# Patient Record
Sex: Male | Born: 1960 | Race: White | Hispanic: No | Marital: Married | Smoking: Former smoker
Health system: Southern US, Community
[De-identification: ages and names within clinical notes are randomized; demographics above are authoritative.]

## PROBLEM LIST (undated history)

## (undated) DIAGNOSIS — M1990 Unspecified osteoarthritis, unspecified site: Secondary | ICD-10-CM

---

## 1979-05-31 HISTORY — PX: APPENDECTOMY: SHX54

## 1981-05-30 HISTORY — PX: BACK SURGERY: SHX140

## 1994-05-30 HISTORY — PX: CHOLECYSTECTOMY: SHX55

## 2005-11-29 ENCOUNTER — Ambulatory Visit: Payer: Self-pay | Admitting: Cardiology

## 2006-01-10 ENCOUNTER — Ambulatory Visit: Payer: Self-pay | Admitting: Cardiology

## 2010-12-09 ENCOUNTER — Other Ambulatory Visit: Payer: Self-pay | Admitting: Neurosurgery

## 2010-12-09 DIAGNOSIS — M549 Dorsalgia, unspecified: Secondary | ICD-10-CM

## 2010-12-12 ENCOUNTER — Ambulatory Visit
Admission: RE | Admit: 2010-12-12 | Discharge: 2010-12-12 | Disposition: A | Discharge status: Home / Self Care | Payer: BC Managed Care – PPO | Source: Ambulatory Visit | Attending: Neurosurgery | Admitting: Neurosurgery

## 2010-12-12 DIAGNOSIS — M549 Dorsalgia, unspecified: Secondary | ICD-10-CM

## 2012-05-30 HISTORY — PX: JOINT REPLACEMENT: SHX530

## 2019-01-24 ENCOUNTER — Encounter: Payer: Self-pay | Admitting: Orthopedic Surgery

## 2019-02-13 ENCOUNTER — Other Ambulatory Visit: Payer: Self-pay

## 2019-02-13 ENCOUNTER — Ambulatory Visit: Payer: BC Managed Care – PPO

## 2019-02-13 ENCOUNTER — Ambulatory Visit: Payer: BC Managed Care – PPO | Admitting: Orthopedic Surgery

## 2019-02-13 VITALS — BP 120/77 | HR 77 | Temp 97.5°F | Ht 73.0 in | Wt 391.0 lb

## 2019-02-13 DIAGNOSIS — M25551 Pain in right hip: Secondary | ICD-10-CM | POA: Diagnosis not present

## 2019-02-13 NOTE — Progress Notes (Signed)
James Bernard  02/13/2019  HISTORY SECTION :  Chief Complaint  Patient presents with  . Hip Pain    Right hip pain.   The patient presents for evaluation of right hip pain  This is a 58 year old male with a BMI of 51.59 who had a left total hip back in 2014 with Dr. Case and even he says he had a small left leg length discrepancy who now presents with 3 or 4-year history of right groin pain radiating to his buttocks with deep dull sharp stabbing pain associated with decreased range of motion trouble bathing trouble putting on his shoes and socks trouble walking and trouble and difficulty coaching football softball and swimming.  He is previously scheduled for replacement at [facility] but they told him he needed to lose weight he did lose the weight got down to 315 pounds giving him a BMI of about 40 but when he came back for the surgery they told him he had to stop smoking.  Dr. Case no longer does hip replacements and he presents to [place] for evaluation    Review of Systems  Musculoskeletal: Positive for joint pain. Negative for back pain.  All other systems reviewed and are negative.    has no past medical history on file.   Medical history he says he has no medical problems  He has had a left total hip and appendectomy in 1982 back surgery 1993 and a gallbladder surgery 1994  Body mass index is 51.59 kg/m.   No Known Allergies   Current Outpatient Medications:  .  diclofenac (VOLTAREN) 75 MG EC tablet, Take 75 mg by mouth 2 (two) times daily., Disp: , Rfl:    PHYSICAL EXAM SECTION: 1) BP 120/77   Pulse 77   Temp (!) 97.5 F (36.4 C)   Ht 6\' 1"  (1.854 m)   Wt (!) 391 lb (177.4 kg)   BMI 51.59 kg/m   Body mass index is 51.59 kg/m. General appearance: Well-developed well-nourished no gross deformities  2) Cardiovascular normal pulse and perfusion in the lower  extremities normal color without edema  3) Neurologically deep tendon reflexes are equal and normal, no  sensation loss or deficits no pathologic reflexes  4) Psychological: Awake alert and oriented x3 mood and affect normal  5) Skin no lacerations or ulcerations no nodularity no palpable masses, no erythema or nodularity  6) Musculoskeletal:   Gait limp and waddles   In the seated position with both knees extended I do not feel the leg length discrepancy they seem to be equal  His left hip motion is 125 degrees of hip flexion with some external rotation on flexion he has no pain when I do this.  He has good hip flexion strength normal muscle tone  On the right side he has decreased abduction painful flexion at 100 degrees painful internal rotation at 5 degrees and limited abduction  No atrophy or weakness is noted  MEDICAL DECISION SECTION:  Encounter Diagnosis  Name Primary?  . Pain in right hip Yes    Imaging X-ray from 2016 shows at least moderate to severe arthritis and the x-ray we took today shows severe arthritis of the right hip with secondary bone changes of sclerosis cyst formation and osteophyte formation    Plan:  (Rx., Inj., surg., Frx, MRI/CT, XR:2)  I recommended that he see a joint replacement surgery for this operation.  He also needs to lose weight to about 315 that would give him about a  40 BMI.  There is no way that we could do the surgery here with limited assistants  I told him I would make him a referral to a joint replacement specialist  5:24 PM Fuller CanadaStanley , MD  02/13/2019

## 2019-02-13 NOTE — Patient Instructions (Signed)
Total Hip Replacement  Total hip replacement is a surgery to replace your damaged hip joint. Your hip joint is replaced with a man-made (artificial) hip joint. This man-made hip joint is called a prosthesis. This surgery is done to lessen pain and to help your hip move better. What happens before the procedure? Staying hydrated Follow instructions from your doctor about drinking fluids. This may include:  Up to 2 hours before surgery - you may keep drinking clear liquids. These include: ? Water. ? Clear fruit juice. ? Black coffee. ? Plain tea. Eating and drinking restrictions Follow instructions from your doctor about eating and drinking. These may include:  8 hours before surgery - stop eating heavy meals or foods. These include meat, fried foods, and fatty foods.  6 hours before surgery - stop eating light meals or foods. These include toast and cereal.  6 hours before surgery - stop drinking milk or drinks that have milk in them.  2 hours before surgery - stop drinking clear liquids. Medicines Ask your doctor about:  Changing or stopping your normal medicines. This is important if you take diabetes medicines or blood thinners.  Taking medicines such as aspirin and ibuprofen. These can thin your blood. Do not take these medicines unless your doctor tells you to take them.  Taking over-the-counter medicines, vitamins, herbs, and supplements. General instructions  You may have a physical exam.  You may have tests, such as: ? X-rays or MRI. ? Blood or urine tests.  Plan to have someone take you home.  Plan to have someone you trust take care of you for at least 24 hours after you leave the hospital or clinic. This is important.  Prepare your home so you can be safe and have easy access to what you need.  Have teeth cleanings or any dental work done weeks before your surgery, or wait until a few weeks after your surgery.  Avoid shaving your legs just before surgery. If  any shaving is needed, it will be done in the hospital.  Ask your doctor how your surgical site will be marked or identified. What happens during the procedure?  To lower your risk of infection: ? Your health care team will wash or sanitize their hands. ? Hair may be removed from the surgical area. ? Your skin will be washed with soap.  An IV tube will be put into one of your veins.  You will be given one or more of the following: ? A medicine to help you relax (sedative). ? A medicine to make you fall asleep (general anesthetic). ? A medicine to numb your body below the waist (spinal anesthetic).  Your doctor will make a cut (incision) in your hip. The place where the cut is made will depend on the approach used by the doctor: ? Posterior approach. The cut will be at the back of the hip. ? Anterior approach. The cut will be at the front of the hip.  Then, your doctor will: ? Use his or her hands to move your hip out of position (dislocate it). ? Cut and take out damaged parts of your hip joint. ? Put a man-made hip joint into place. ? Do an X-ray of the hip joint to make sure it is in the right place. ? Place a drain to remove extra fluid, if needed. ? Close the cut and place a bandage (dressing) over it. The procedure may vary among doctors and hospitals. What happens after the procedure?  Your health care team will: ? Monitor you until you leave the hospital. ? Check your blood pressure, heart rate, breathing rate, and blood oxygen level. ? Check if you can move your foot and can feel sensations in it. ? Give you pain medicine.  Your doctor will tell you to take actions to help prevent blood clots and reduce swelling in your legs. You may need to: ? Wear a type of socks that are tight (compression stockings). ? Take medicines to thin your blood (anticoagulants).  You will do exercises (physical therapy) until you are doing well. Your doctor will tell you when you are well  enough to go home.  You may need to use a walker or crutches.  You may need to use a wedge pillow (hip abduction pillow) when you are in bed. This pillow will keep your legs from turning in ways that may cause your new hip joint to move out of place. Summary  Total hip replacement is a surgery to replace your damaged hip joint. Your hip joint is replaced with a man-made (artificial) hip joint.  Follow instructions from your doctor about eating and drinking before the procedure.  Plan to have someone take you home from the hospital.  You may need to use a walker or crutches after surgery. This information is not intended to replace advice given to you by your health care provider. Make sure you discuss any questions you have with your health care provider. Document Released: 08/08/2011 Document Revised: 06/08/2018 Document Reviewed: 06/27/2017 Elsevier Interactive Patient Education  2020 [employer]  Calorie Counting for [employer] Calories are units of energy. Your body needs a certain amount of calories from food to keep you going throughout the day. When you eat more calories than your body needs, your body stores the extra calories as fat. When you eat fewer calories than your body needs, your body burns fat to get the energy it needs. Calorie counting means keeping track of how many calories you eat and drink each day. Calorie counting can be helpful if you need to lose weight. If you make sure to eat fewer calories than your body needs, you should lose weight. Ask your health care provider what a healthy weight is for you. For calorie counting to work, you will need to eat the right number of calories in a day in order to lose a healthy amount of weight per week. A dietitian can help you determine how many calories you need in a day and will give you suggestions on how to reach your calorie goal.  A healthy amount of weight to lose per week is usually 1-2 lb (0.5-0.9 kg). This usually  means that your daily calorie intake should be reduced by 500-750 calories.  Eating 1,200 - 1,500 calories per day can help most women lose weight.  Eating 1,500 - 1,800 calories per day can help most men lose weight. What is my plan? My goal is to have __________ calories per day. If I have this many calories per day, I should lose around __________ pounds per week. What do I need to know about calorie counting? In order to meet your daily calorie goal, you will need to:  Find out how many calories are in each food you would like to eat. Try to do this before you eat.  Decide how much of the food you plan to eat.  Write down what you ate and how many calories it had. Doing this is  called keeping a food log. To successfully lose weight, it is important to balance calorie counting with a healthy lifestyle that includes regular activity. Aim for 150 minutes of moderate exercise (such as walking) or 75 minutes of vigorous exercise (such as running) each week. Where do I find calorie information?  The number of calories in a food can be found on a Nutrition Facts label. If a food does not have a Nutrition Facts label, try to look up the calories online or ask your dietitian for help. Remember that calories are listed per serving. If you choose to have more than one serving of a food, you will have to multiply the calories per serving by the amount of servings you plan to eat. For example, the label on a package of bread might say that a serving size is 1 slice and that there are 90 calories in a serving. If you eat 1 slice, you will have eaten 90 calories. If you eat 2 slices, you will have eaten 180 calories. How do I keep a food log? Immediately after each meal, record the following information in your food log:  What you ate. Don't forget to include toppings, sauces, and other extras on the food.  How much you ate. This can be measured in cups, ounces, or number of items.  How many  calories each food and drink had.  The total number of calories in the meal. Keep your food log near you, such as in a small notebook in your pocket, or use a mobile app or website. Some programs will calculate calories for you and show you how many calories you have left for the day to meet your goal. What are some calorie counting tips?   Use your calories on foods and drinks that will fill you up and not leave you hungry: ? Some examples of foods that fill you up are nuts and nut butters, vegetables, lean proteins, and high-fiber foods like whole grains. High-fiber foods are foods with more than 5 g fiber per serving. ? Drinks such as sodas, specialty coffee drinks, alcohol, and juices have a lot of calories, yet do not fill you up.  Eat nutritious foods and avoid empty calories. Empty calories are calories you get from foods or beverages that do not have many vitamins or protein, such as candy, sweets, and soda. It is better to have a nutritious high-calorie food (such as an avocado) than a food with few nutrients (such as a bag of chips).  Know how many calories are in the foods you eat most often. This will help you calculate calorie counts faster.  Pay attention to calories in drinks. Low-calorie drinks include water and unsweetened drinks.  Pay attention to nutrition labels for "low fat" or "fat free" foods. These foods sometimes have the same amount of calories or more calories than the full fat versions. They also often have added sugar, starch, or salt, to make up for flavor that was removed with the fat.  Find a way of tracking calories that works for you. Get creative. Try different apps or programs if writing down calories does not work for you. What are some portion control tips?  Know how many calories are in a serving. This will help you know how many servings of a certain food you can have.  Use a measuring cup to measure serving sizes. You could also try weighing out  portions on a kitchen scale. With time, you will be able to  estimate serving sizes for some foods.  Take some time to put servings of different foods on your favorite plates, bowls, and cups so you know what a serving looks like.  Try not to eat straight from a bag or box. Doing this can lead to overeating. Put the amount you would like to eat in a cup or on a plate to make sure you are eating the right portion.  Use smaller plates, glasses, and bowls to prevent overeating.  Try not to multitask (for example, watch TV or use your computer) while eating. If it is time to eat, sit down at a table and enjoy your food. This will help you to know when you are full. It will also help you to be aware of what you are eating and how much you are eating. What are tips for following this plan? Reading food labels  Check the calorie count compared to the serving size. The serving size may be smaller than what you are used to eating.  Check the source of the calories. Make sure the food you are eating is high in vitamins and protein and low in saturated and trans fats. Shopping  Read nutrition labels while you shop. This will help you make healthy decisions before you decide to purchase your food.  Make a grocery list and stick to it. Cooking  Try to cook your favorite foods in a healthier way. For example, try baking instead of frying.  Use low-fat dairy products. Meal planning  Use more fruits and vegetables. Half of your plate should be fruits and vegetables.  Include lean proteins like poultry and fish. How do I count calories when eating out?  Ask for smaller portion sizes.  Consider sharing an entree and sides instead of getting your own entree.  If you get your own entree, eat only half. Ask for a box at the beginning of your meal and put the rest of your entree in it so you are not tempted to eat it.  If calories are listed on the menu, choose the lower calorie options.  Choose  dishes that include vegetables, fruits, whole grains, low-fat dairy products, and lean protein.  Choose items that are boiled, broiled, grilled, or steamed. Stay away from items that are buttered, battered, fried, or served with cream sauce. Items labeled "crispy" are usually fried, unless stated otherwise.  Choose water, low-fat milk, unsweetened iced tea, or other drinks without added sugar. If you want an alcoholic beverage, choose a lower calorie option such as a glass of wine or light beer.  Ask for dressings, sauces, and syrups on the side. These are usually high in calories, so you should limit the amount you eat.  If you want a salad, choose a garden salad and ask for grilled meats. Avoid extra toppings like bacon, cheese, or fried items. Ask for the dressing on the side, or ask for olive oil and vinegar or lemon to use as dressing.  Estimate how many servings of a food you are given. For example, a serving of cooked rice is  cup or about the size of half a baseball. Knowing serving sizes will help you be aware of how much food you are eating at restaurants. The list below tells you how big or small some common portion sizes are based on everyday objects: ? 1 oz-4 stacked dice. ? 3 oz-1 deck of cards. ? 1 tsp-1 die. ? 1 Tbsp- a ping-pong ball. ? 2 Tbsp-1 ping-pong ball. ?  cup- baseball. ? 1 cup-1 baseball. Summary  Calorie counting means keeping track of how many calories you eat and drink each day. If you eat fewer calories than your body needs, you should lose weight.  A healthy amount of weight to lose per week is usually 1-2 lb (0.5-0.9 kg). This usually means reducing your daily calorie intake by 500-750 calories.  The number of calories in a food can be found on a Nutrition Facts label. If a food does not have a Nutrition Facts label, try to look up the calories online or ask your dietitian for help.  Use your calories on foods and drinks that will fill you up, and not on  foods and drinks that will leave you hungry.  Use smaller plates, glasses, and bowls to prevent overeating. This information is not intended to replace advice given to you by your health care provider. Make sure you discuss any questions you have with your health care provider. Document Released: 05/16/2005 Document Revised: 02/02/2018 Document Reviewed: 04/15/2016 Elsevier Patient Education  2020 [employer].

## 2020-03-26 NOTE — Patient Instructions (Addendum)
DUE TO COVID-19 ONLY ONE VISITOR IS ALLOWED TO COME WITH YOU AND STAY IN THE WAITING ROOM ONLY DURING PRE OP AND PROCEDURE DAY OF SURGERY. THE 1 VISITOR  MAY VISIT WITH YOU AFTER SURGERY IN YOUR PRIVATE ROOM DURING VISITING HOURS ONLY!    PLEASE BEGIN THE QUARANTINE INSTRUCTIONS AS OUTLINED IN YOUR HANDOUT.                James Bernard   Your procedure is scheduled on: 04/08/20   Report to [facility] Main  Entrance   Report to admitting at  8:50 AM     Call this number if you have problems the morning of surgery [phone]      BRUSH YOUR TEETH MORNING OF SURGERY AND RINSE YOUR MOUTH OUT, NO CHEWING GUM CANDY OR MINTS.   No food after midnight.    You may have clear liquid until 8:30 AM.    At 8:30 AM drink pre surgery drink.   Nothing by mouth after 8:30 AM.   Take these medicines the morning of surgery with A SIP OF WATER: None                                 You may not have any metal on your body including               piercings  Do not wear jewelry,  lotions, powders or deodorant          .              Men may shave face and neck.   Do not bring valuables to the hospital. [facility] IS NOT             RESPONSIBLE   FOR VALUABLES.  Contacts, dentures or bridgework may not be worn into surgery.       Patients discharged the day of surgery will not be allowed to drive home.   IF YOU ARE HAVING SURGERY AND GOING HOME THE SAME DAY, YOU MUST HAVE AN ADULT TO DRIVE YOU HOME AND BE WITH YOU FOR 24 HOURS.   YOU MAY GO HOME BY TAXI OR UBER OR ORTHERWISE, BUT AN ADULT MUST ACCOMPANY YOU HOME AND STAY WITH YOU FOR 24 HOURS.  Name and phone number of your driver:  Special Instructions: N/A              Please read over the following fact sheets you were given: _____________________________________________________________________             [facility] - Preparing for Surgery Before surgery, you can play an important role.  Because skin is not  sterile, your skin needs to be as free of germs as possible.  You can reduce the number of germs on your skin by washing with CHG (chlorahexidine gluconate) soap before surgery.  CHG is an antiseptic cleaner which kills germs and bonds with the skin to continue killing germs even after washing. Please DO NOT use if you have an allergy to CHG or antibacterial soaps.  If your skin becomes reddened/irritated stop using the CHG and inform your nurse when you arrive at Short Stay.   You may shave your face/neck.  Please follow these instructions carefully:  1.  Shower with CHG Soap the night before surgery and the  morning of Surgery.  2.  If you choose to wash your hair, wash your hair  first as usual with your  normal  shampoo.  3.  After you shampoo, rinse your hair and body thoroughly to remove the  shampoo.                                        4.  Use CHG as you would any other liquid soap.  You can apply chg directly  to the skin and wash                       Gently with a scrungie or clean washcloth.  5.  Apply the CHG Soap to your body ONLY FROM THE NECK DOWN.   Do not use on face/ open                           Wound or open sores. Avoid contact with eyes, ears mouth and genitals (private parts).                       Wash face,  Genitals (private parts) with your normal soap.             6.  Wash thoroughly, paying special attention to the area where your surgery  will be performed.  7.  Thoroughly rinse your body with warm water from the neck down.  8.  DO NOT shower/wash with your normal soap after using and rinsing off  the CHG Soap.             9.  Pat yourself dry with a clean towel.            10.  Wear clean pajamas.            11.  Place clean sheets on your bed the night of your first shower and do not  sleep with pets. Day of Surgery : Do not apply any lotions/deodorants the morning of surgery.  Please wear clean clothes to the hospital/surgery center.  FAILURE TO FOLLOW THESE  INSTRUCTIONS MAY RESULT IN THE CANCELLATION OF YOUR SURGERY PATIENT SIGNATURE_________________________________  NURSE SIGNATURE__________________________________  ________________________________________________________________________   James Bernard  An incentive spirometer is a tool that can help keep your lungs clear and active. This tool measures how well you are filling your lungs with each breath. Taking long deep breaths may help reverse or decrease the chance of developing breathing (pulmonary) problems (especially infection) following:  A long period of time when you are unable to move or be active. BEFORE THE PROCEDURE   If the spirometer includes an indicator to show your best effort, your nurse or respiratory therapist will set it to a desired goal.  If possible, sit up straight or lean slightly forward. Try not to slouch.  Hold the incentive spirometer in an upright position. INSTRUCTIONS FOR USE  1. Sit on the edge of your bed if possible, or sit up as far as you can in bed or on a chair. 2. Hold the incentive spirometer in an upright position. 3. Breathe out normally. 4. Place the mouthpiece in your mouth and seal your lips tightly around it. 5. Breathe in slowly and as deeply as possible, raising the piston or the ball toward the top of the column. 6. Hold your breath for 3-5 seconds or for as long as possible. Allow the piston or ball to  fall to the bottom of the column. 7. Remove the mouthpiece from your mouth and breathe out normally. 8. Rest for a few seconds and repeat Steps 1 through 7 at least 10 times every 1-2 hours when you are awake. Take your time and take a few normal breaths between deep breaths. 9. The spirometer may include an indicator to show your best effort. Use the indicator as a goal to work toward during each repetition. 10. After each set of 10 deep breaths, practice coughing to be sure your lungs are clear. If you have an incision (the  cut made at the time of surgery), support your incision when coughing by placing a pillow or rolled up towels firmly against it. Once you are able to get out of bed, walk around indoors and cough well. You may stop using the incentive spirometer when instructed by your caregiver.  RISKS AND COMPLICATIONS  Take your time so you do not get dizzy or light-headed.  If you are in pain, you may need to take or ask for pain medication before doing incentive spirometry. It is harder to take a deep breath if you are having pain. AFTER USE  Rest and breathe slowly and easily.  It can be helpful to keep track of a log of your progress. Your caregiver can provide you with a simple table to help with this. If you are using the spirometer at home, follow these instructions: SEEK MEDICAL CARE IF:   You are having difficultly using the spirometer.  You have trouble using the spirometer as often as instructed.  Your pain medication is not giving enough relief while using the spirometer.  You develop fever of 100.5 F (38.1 C) or higher. SEEK IMMEDIATE MEDICAL CARE IF:   You cough up bloody sputum that had not been present before.  You develop fever of 102 F (38.9 C) or greater.  You develop worsening pain at or near the incision site. MAKE SURE YOU:   Understand these instructions.  Will watch your condition.  Will get help right away if you are not doing well or get worse. Document Released: 09/26/2006 Document Revised: 08/08/2011 Document Reviewed: 11/27/2006 [facility] Patient Information 2014 [place], [place].   ________________________________________________________________________

## 2020-03-27 ENCOUNTER — Ambulatory Visit: Payer: Self-pay | Admitting: Student

## 2020-03-31 ENCOUNTER — Encounter (HOSPITAL_COMMUNITY): Payer: Self-pay

## 2020-03-31 ENCOUNTER — Other Ambulatory Visit: Payer: Self-pay

## 2020-03-31 ENCOUNTER — Encounter (HOSPITAL_COMMUNITY)
Admission: RE | Admit: 2020-03-31 | Discharge: 2020-03-31 | Disposition: A | Discharge status: Home / Self Care | Payer: BC Managed Care – PPO | Source: Ambulatory Visit | Attending: Orthopedic Surgery | Admitting: Orthopedic Surgery

## 2020-03-31 DIAGNOSIS — Z01812 Encounter for preprocedural laboratory examination: Secondary | ICD-10-CM | POA: Insufficient documentation

## 2020-03-31 HISTORY — DX: Unspecified osteoarthritis, unspecified site: M19.90

## 2020-03-31 LAB — COMPREHENSIVE METABOLIC PANEL
ALT: 26 U/L (ref 0–44)
AST: 19 U/L (ref 15–41)
Albumin: 4.2 g/dL (ref 3.5–5.0)
Alkaline Phosphatase: 57 U/L (ref 38–126)
Anion gap: 9 (ref 5–15)
BUN: 17 mg/dL (ref 8–23)
CO2: 30 mmol/L (ref 22–32)
Calcium: 9.2 mg/dL (ref 8.9–10.3)
Chloride: 100 mmol/L (ref 98–111)
Creatinine, Ser: 0.78 mg/dL (ref 0.61–1.24)
GFR, Estimated: >60 mL/min (ref >60)
Glucose, Bld: 99 mg/dL (ref 70–99)
Potassium: 5 mmol/L (ref 3.5–5.1)
Sodium: 139 mmol/L (ref 135–145)
Total Bilirubin: 0.7 mg/dL (ref 0.3–1.2)
Total Protein: 7.5 g/dL (ref 6.5–8.1)

## 2020-03-31 LAB — CBC
HCT: 47.6 % (ref 39.0–52.0)
Hemoglobin: 14.7 g/dL (ref 13.0–17.0)
MCH: 28.1 pg (ref 26.0–34.0)
MCHC: 30.9 g/dL (ref 30.0–36.0)
MCV: 90.8 fL (ref 80.0–100.0)
Platelets: 256 10*3/uL (ref 150–400)
RBC: 5.24 MIL/uL (ref 4.22–5.81)
RDW: 14.1 % (ref 11.5–15.5)
WBC: 8 10*3/uL (ref 4.0–10.5)
nRBC: 0 % (ref 0.0–0.2)

## 2020-03-31 LAB — URINALYSIS, ROUTINE W REFLEX MICROSCOPIC
Bilirubin Urine: NEGATIVE
Glucose, UA: NEGATIVE mg/dL
Hgb urine dipstick: NEGATIVE
Ketones, ur: NEGATIVE mg/dL
Leukocytes,Ua: NEGATIVE
Nitrite: NEGATIVE
Protein, ur: NEGATIVE mg/dL
Specific Gravity, Urine: 1.026 (ref 1.005–1.030)
pH: 6 (ref 5.0–8.0)

## 2020-03-31 LAB — SURGICAL PCR SCREEN
MRSA, PCR: NEGATIVE
Staphylococcus aureus: NEGATIVE

## 2020-03-31 LAB — PROTIME-INR
INR: 1 (ref 0.8–1.2)
Prothrombin Time: 12.4 seconds (ref 11.4–15.2)

## 2020-03-31 NOTE — Progress Notes (Signed)
COVID Vaccine Completed:yes Date COVID Vaccine completed:08/26/19 COVID vaccine manufacturer:   Moderna     PCP - Dr. Nickie Retort Cardiologist - no  Chest x-ray - no EKG - no Stress Test - no ECHO - no Cardiac Cath - no Pacemaker/ICD device last checked:NA  Sleep Study - no CPAP -   Fasting Blood Sugar - NA Checks Blood Sugar _____ times a day  Blood Thinner Instructions:NA Aspirin Instructions: Last Dose:  Anesthesia review:   Patient denies shortness of breath, fever, cough and chest pain at PAT appointment  yes  Patient verbalized understanding of instructions that were given to them at the PAT appointment. Patient was also instructed that they will need to review over the PAT instructions again at home before surgery. Yes Pt is unable to climb stairs. He reports that he has no SOB working or with ADLs. He has a BMI of47.2

## 2020-04-01 ENCOUNTER — Encounter (HOSPITAL_COMMUNITY): Payer: Self-pay

## 2020-04-03 ENCOUNTER — Ambulatory Visit: Payer: Self-pay | Admitting: Student

## 2020-04-03 NOTE — H&P (Signed)
TOTAL HIP ADMISSION H&P  Patient is admitted for right total hip arthroplasty.  Subjective:  Chief Complaint: right hip pain  HPI: James Bernard, 59 y.o. male, has a history of pain and functional disability in the right hip(s) due to avascular necrosis and patient has failed non-surgical conservative treatments for greater than 12 weeks to include NSAID's and/or analgesics, corticosteriod injections and activity modification.  Onset of symptoms was gradual starting 3 years ago with rapidlly worsening course since that time.The patient noted no past surgery on the right hip(s).  Patient currently rates pain in the right hip at 8 out of 10 with activity. Patient has worsening of pain with activity and weight bearing, pain that interfers with activities of daily living and pain with passive range of motion. Patient has evidence of joint space narrowing and collapse of the femoral head by imaging studies. This condition presents safety issues increasing the risk of falls. There is no current active infection.  There are no problems to display for this patient.  Past Medical History:  Diagnosis Date  . Arthritis    hands, shoulders    Past Surgical History:  Procedure Laterality Date  . APPENDECTOMY  1981  . BACK SURGERY  1983   L4-L5  . CHOLECYSTECTOMY  1996  . JOINT REPLACEMENT Left 2014    Current Outpatient Medications  Medication Sig Dispense Refill Last Dose  . acetaminophen (TYLENOL) 500 MG tablet Take 1,000 mg by mouth every 6 (six) hours as needed for moderate pain.     Marland Kitchen diclofenac (VOLTAREN) 75 MG EC tablet Take 75 mg by mouth 2 (two) times daily. (Patient not taking: Reported on 03/26/2020)     . traMADol (ULTRAM) 50 MG tablet Take 50-100 mg by mouth every 6 (six) hours as needed for moderate pain.       No current facility-administered medications for this visit.   No Known Allergies  Social History   Tobacco Use  . Smoking status: Former Smoker    Packs/day: 0.25     Years: 15.00    Pack years: 3.75    Types: Cigarettes    Quit date: 03/31/2017    Years since quitting: 3.0  . Smokeless tobacco: Never Used  Substance Use Topics  . Alcohol use: Never    No family history on file.   Review of Systems  Constitutional: Negative.   HENT: Negative.   Eyes: Negative.   Respiratory: Negative.   Cardiovascular: Negative.   Gastrointestinal: Negative.   Endocrine: Negative.   Genitourinary: Negative.   Musculoskeletal: Positive for arthralgias.  Skin: Negative.   Allergic/Immunologic: Negative.   Neurological: Negative.   Hematological: Negative.   Psychiatric/Behavioral: Negative.     Objective:  Physical Exam Constitutional:      Appearance: He is obese.  HENT:     Head: Normocephalic.  Eyes:     Pupils: Pupils are equal, round, and reactive to light.  Cardiovascular:     Rate and Rhythm: Regular rhythm.     Heart sounds: Normal heart sounds.  Pulmonary:     Breath sounds: Normal breath sounds.  Abdominal:     Palpations: Abdomen is soft.     Tenderness: There is no abdominal tenderness.  Genitourinary:    Comments: Deferred Musculoskeletal:     Cervical back: Normal range of motion.     Comments: Examination of the right hip reveals no skin wounds or lesions. The pannicular fold is clear. He does have moderate trochanteric tenderness to palpation. The  right hip is extremely stiff. He has a 45 degree flexion contracture, and I can flex him up to 70 degrees. He internally rotates -10 degrees, and externally rotates 15 degrees. He has pain with flexion and rotation.  Skin:    General: Skin is warm and dry.  Neurological:     Mental Status: He is alert and oriented to person, place, and time.  Psychiatric:        Mood and Affect: Mood normal.     Vital signs in last 24 hours: @VSRANGES @  Labs:   Estimated body mass index is 47.2 kg/m as calculated from the following:   Height as of 03/31/20: 6' (1.829 m).   Weight as of  03/31/20: 157.9 kg.   Imaging Review Plain radiographs demonstrate severe degenerative joint disease of the right hip(s). The bone quality appears to be adequate for age and reported activity level.      Assessment/Plan:  End stage arthritis, right hip(s)  The patient history, physical examination, clinical judgement of the provider and imaging studies are consistent with end stage degenerative joint disease of the right hip(s) and total hip arthroplasty is deemed medically necessary. The treatment options including medical management, injection therapy, arthroscopy and arthroplasty were discussed at length. The risks and benefits of total hip arthroplasty were presented and reviewed. The risks due to aseptic loosening, infection, stiffness, dislocation/subluxation,  thromboembolic complications and other imponderables were discussed.  The patient acknowledged the explanation, agreed to proceed with the plan and consent was signed. Patient is being admitted for inpatient treatment for surgery, pain control, PT, OT, prophylactic antibiotics, VTE prophylaxis, progressive ambulation and ADL's and discharge planning.The patient is planning to be discharged home after an overnight stay

## 2020-04-03 NOTE — H&P (View-Only) (Signed)
TOTAL HIP ADMISSION H&P  Patient is admitted for right total hip arthroplasty.  Subjective:  Chief Complaint: right hip pain  HPI: James Bernard, 59 y.o. male, has a history of pain and functional disability in the right hip(s) due to avascular necrosis and patient has failed non-surgical conservative treatments for greater than 12 weeks to include NSAID's and/or analgesics, corticosteriod injections and activity modification.  Onset of symptoms was gradual starting 3 years ago with rapidlly worsening course since that time.The patient noted no past surgery on the right hip(s).  Patient currently rates pain in the right hip at 8 out of 10 with activity. Patient has worsening of pain with activity and weight bearing, pain that interfers with activities of daily living and pain with passive range of motion. Patient has evidence of joint space narrowing and collapse of the femoral head by imaging studies. This condition presents safety issues increasing the risk of falls. There is no current active infection.  There are no problems to display for this patient.  Past Medical History:  Diagnosis Date  . Arthritis    hands, shoulders    Past Surgical History:  Procedure Laterality Date  . APPENDECTOMY  1981  . BACK SURGERY  1983   L4-L5  . CHOLECYSTECTOMY  1996  . JOINT REPLACEMENT Left 2014    Current Outpatient Medications  Medication Sig Dispense Refill Last Dose  . acetaminophen (TYLENOL) 500 MG tablet Take 1,000 mg by mouth every 6 (six) hours as needed for moderate pain.     Marland Kitchen diclofenac (VOLTAREN) 75 MG EC tablet Take 75 mg by mouth 2 (two) times daily. (Patient not taking: Reported on 03/26/2020)     . traMADol (ULTRAM) 50 MG tablet Take 50-100 mg by mouth every 6 (six) hours as needed for moderate pain.       No current facility-administered medications for this visit.   No Known Allergies  Social History   Tobacco Use  . Smoking status: Former Smoker    Packs/day: 0.25     Years: 15.00    Pack years: 3.75    Types: Cigarettes    Quit date: 03/31/2017    Years since quitting: 3.0  . Smokeless tobacco: Never Used  Substance Use Topics  . Alcohol use: Never    No family history on file.   Review of Systems  Constitutional: Negative.   HENT: Negative.   Eyes: Negative.   Respiratory: Negative.   Cardiovascular: Negative.   Gastrointestinal: Negative.   Endocrine: Negative.   Genitourinary: Negative.   Musculoskeletal: Positive for arthralgias.  Skin: Negative.   Allergic/Immunologic: Negative.   Neurological: Negative.   Hematological: Negative.   Psychiatric/Behavioral: Negative.     Objective:  Physical Exam Constitutional:      Appearance: He is obese.  HENT:     Head: Normocephalic.  Eyes:     Pupils: Pupils are equal, round, and reactive to light.  Cardiovascular:     Rate and Rhythm: Regular rhythm.     Heart sounds: Normal heart sounds.  Pulmonary:     Breath sounds: Normal breath sounds.  Abdominal:     Palpations: Abdomen is soft.     Tenderness: There is no abdominal tenderness.  Genitourinary:    Comments: Deferred Musculoskeletal:     Cervical back: Normal range of motion.     Comments: Examination of the right hip reveals no skin wounds or lesions. The pannicular fold is clear. He does have moderate trochanteric tenderness to palpation. The  right hip is extremely stiff. He has a 45 degree flexion contracture, and I can flex him up to 70 degrees. He internally rotates -10 degrees, and externally rotates 15 degrees. He has pain with flexion and rotation.  Skin:    General: Skin is warm and dry.  Neurological:     Mental Status: He is alert and oriented to person, place, and time.  Psychiatric:        Mood and Affect: Mood normal.     Vital signs in last 24 hours: @VSRANGES@  Labs:   Estimated body mass index is 47.2 kg/m as calculated from the following:   Height as of 03/31/20: 6' (1.829 m).   Weight as of  03/31/20: 157.9 kg.   Imaging Review Plain radiographs demonstrate severe degenerative joint disease of the right hip(s). The bone quality appears to be adequate for age and reported activity level.      Assessment/Plan:  End stage arthritis, right hip(s)  The patient history, physical examination, clinical judgement of the provider and imaging studies are consistent with end stage degenerative joint disease of the right hip(s) and total hip arthroplasty is deemed medically necessary. The treatment options including medical management, injection therapy, arthroscopy and arthroplasty were discussed at length. The risks and benefits of total hip arthroplasty were presented and reviewed. The risks due to aseptic loosening, infection, stiffness, dislocation/subluxation,  thromboembolic complications and other imponderables were discussed.  The patient acknowledged the explanation, agreed to proceed with the plan and consent was signed. Patient is being admitted for inpatient treatment for surgery, pain control, PT, OT, prophylactic antibiotics, VTE prophylaxis, progressive ambulation and ADL's and discharge planning.The patient is planning to be discharged home after an overnight stay    

## 2020-04-07 MED ORDER — DEXTROSE 5 % IV SOLN
3.0000 g | INTRAVENOUS | Status: AC
  Administered 2020-04-08: 3 g via INTRAVENOUS
  Filled 2020-04-07: qty 3

## 2020-04-08 ENCOUNTER — Ambulatory Visit (HOSPITAL_COMMUNITY): Payer: BC Managed Care – PPO

## 2020-04-08 ENCOUNTER — Encounter (HOSPITAL_COMMUNITY): Payer: Self-pay | Admitting: Orthopedic Surgery

## 2020-04-08 ENCOUNTER — Encounter (HOSPITAL_COMMUNITY)
Admission: RE | Disposition: A | Discharge status: Home / Self Care | Payer: Self-pay | Source: Home / Self Care | Attending: Orthopedic Surgery

## 2020-04-08 ENCOUNTER — Other Ambulatory Visit: Payer: Self-pay

## 2020-04-08 ENCOUNTER — Observation Stay (HOSPITAL_COMMUNITY)
Admission: RE | Admit: 2020-04-08 | Discharge: 2020-04-11 | Disposition: A | Discharge status: Home / Self Care | Payer: BC Managed Care – PPO | Attending: Orthopedic Surgery | Admitting: Orthopedic Surgery

## 2020-04-08 ENCOUNTER — Ambulatory Visit (HOSPITAL_COMMUNITY): Payer: BC Managed Care – PPO | Admitting: Certified Registered Nurse Anesthetist

## 2020-04-08 DIAGNOSIS — Z419 Encounter for procedure for purposes other than remedying health state, unspecified: Secondary | ICD-10-CM

## 2020-04-08 DIAGNOSIS — Z09 Encounter for follow-up examination after completed treatment for conditions other than malignant neoplasm: Secondary | ICD-10-CM

## 2020-04-08 DIAGNOSIS — M1611 Unilateral primary osteoarthritis, right hip: Principal | ICD-10-CM | POA: Diagnosis present

## 2020-04-08 DIAGNOSIS — M25551 Pain in right hip: Secondary | ICD-10-CM | POA: Diagnosis present

## 2020-04-08 DIAGNOSIS — Z87442 Personal history of urinary calculi: Secondary | ICD-10-CM | POA: Insufficient documentation

## 2020-04-08 HISTORY — PX: TOTAL HIP ARTHROPLASTY: SHX124

## 2020-04-08 LAB — ABO/RH: ABO/RH(D): O POS

## 2020-04-08 LAB — TYPE AND SCREEN
ABO/RH(D): O POS
Antibody Screen: NEGATIVE

## 2020-04-08 SURGERY — ARTHROPLASTY, HIP, TOTAL, ANTERIOR APPROACH
Anesthesia: Spinal | Site: Hip | Laterality: Right

## 2020-04-08 MED ORDER — PROPOFOL 500 MG/50ML IV EMUL
INTRAVENOUS | Status: DC | PRN
  Administered 2020-04-08 (×2): 30 mg via INTRAVENOUS
  Administered 2020-04-08 (×2): 40 mg via INTRAVENOUS

## 2020-04-08 MED ORDER — SODIUM CHLORIDE 0.9 % IR SOLN
Status: DC | PRN
  Administered 2020-04-08: 1000 mL
  Administered 2020-04-08: 3000 mL

## 2020-04-08 MED ORDER — MIDAZOLAM HCL 2 MG/2ML IJ SOLN
INTRAMUSCULAR | Status: AC
  Filled 2020-04-08: qty 2

## 2020-04-08 MED ORDER — MORPHINE SULFATE (PF) 2 MG/ML IV SOLN: 0.5000 mg | INTRAVENOUS | Status: DC | PRN

## 2020-04-08 MED ORDER — METOCLOPRAMIDE HCL 5 MG PO TABS
5.0000 mg | ORAL_TABLET | Freq: Three times a day (TID) | ORAL | Status: DC | PRN
  Filled 2020-04-08: qty 1

## 2020-04-08 MED ORDER — ONDANSETRON HCL 4 MG/2ML IJ SOLN
INTRAMUSCULAR | Status: DC | PRN
  Administered 2020-04-08: 4 mg via INTRAVENOUS

## 2020-04-08 MED ORDER — POLYETHYLENE GLYCOL 3350 17 G PO PACK: 17.0000 g | PACK | Freq: Every day | ORAL | Status: DC | PRN

## 2020-04-08 MED ORDER — SODIUM CHLORIDE 0.9 % IV SOLN: INTRAVENOUS | Status: DC

## 2020-04-08 MED ORDER — METOCLOPRAMIDE HCL 5 MG/ML IJ SOLN: 5.0000 mg | Freq: Three times a day (TID) | INTRAMUSCULAR | Status: DC | PRN

## 2020-04-08 MED ORDER — HYDROCODONE-ACETAMINOPHEN 7.5-325 MG PO TABS
1.0000 | ORAL_TABLET | ORAL | Status: DC | PRN
  Administered 2020-04-08 – 2020-04-09 (×2): 2 via ORAL
  Administered 2020-04-09: 1 via ORAL
  Administered 2020-04-10 – 2020-04-11 (×8): 2 via ORAL
  Filled 2020-04-08 (×11): qty 2

## 2020-04-08 MED ORDER — MENTHOL 3 MG MT LOZG: 1.0000 | LOZENGE | OROMUCOSAL | Status: DC | PRN

## 2020-04-08 MED ORDER — ONDANSETRON HCL 4 MG/2ML IJ SOLN: 4.0000 mg | Freq: Four times a day (QID) | INTRAMUSCULAR | Status: DC | PRN

## 2020-04-08 MED ORDER — CHLORHEXIDINE GLUCONATE 0.12 % MT SOLN
15.0000 mL | Freq: Once | OROMUCOSAL | Status: AC
  Administered 2020-04-08: 15 mL via OROMUCOSAL

## 2020-04-08 MED ORDER — LACTATED RINGERS IV SOLN: INTRAVENOUS | Status: DC

## 2020-04-08 MED ORDER — PROMETHAZINE HCL 25 MG/ML IJ SOLN: 6.2500 mg | INTRAMUSCULAR | Status: DC | PRN

## 2020-04-08 MED ORDER — DIPHENHYDRAMINE HCL 12.5 MG/5ML PO ELIX
12.5000 mg | ORAL_SOLUTION | ORAL | Status: DC | PRN
  Administered 2020-04-08: 25 mg via ORAL
  Filled 2020-04-08: qty 10

## 2020-04-08 MED ORDER — SODIUM CHLORIDE (PF) 0.9 % IJ SOLN
INTRAMUSCULAR | Status: AC
  Filled 2020-04-08: qty 50

## 2020-04-08 MED ORDER — KETOROLAC TROMETHAMINE 30 MG/ML IJ SOLN
INTRAMUSCULAR | Status: AC
  Filled 2020-04-08: qty 1

## 2020-04-08 MED ORDER — FENTANYL CITRATE (PF) 100 MCG/2ML IJ SOLN
INTRAMUSCULAR | Status: AC
  Filled 2020-04-08: qty 2

## 2020-04-08 MED ORDER — ACETAMINOPHEN 10 MG/ML IV SOLN
1000.0000 mg | Freq: Once | INTRAVENOUS | Status: AC
  Administered 2020-04-08: 1000 mg via INTRAVENOUS
  Filled 2020-04-08: qty 100

## 2020-04-08 MED ORDER — DOCUSATE SODIUM 100 MG PO CAPS
100.0000 mg | ORAL_CAPSULE | Freq: Two times a day (BID) | ORAL | Status: DC
  Administered 2020-04-08 – 2020-04-11 (×6): 100 mg via ORAL
  Filled 2020-04-08 (×6): qty 1

## 2020-04-08 MED ORDER — ONDANSETRON HCL 4 MG PO TABS: 4.0000 mg | ORAL_TABLET | Freq: Four times a day (QID) | ORAL | Status: DC | PRN

## 2020-04-08 MED ORDER — POVIDONE-IODINE 10 % EX SWAB: 2.0000 "application " | Freq: Once | CUTANEOUS | Status: DC

## 2020-04-08 MED ORDER — ISOPROPYL ALCOHOL 70 % SOLN
Status: DC | PRN
  Administered 2020-04-08: 1 via TOPICAL

## 2020-04-08 MED ORDER — ALBUMIN HUMAN 5 % IV SOLN
INTRAVENOUS | Status: AC
  Filled 2020-04-08: qty 250

## 2020-04-08 MED ORDER — KETOROLAC TROMETHAMINE 30 MG/ML IJ SOLN: 30.0000 mg | Freq: Once | INTRAMUSCULAR | Status: DC | PRN

## 2020-04-08 MED ORDER — GLYCOPYRROLATE PF 0.2 MG/ML IJ SOSY
PREFILLED_SYRINGE | INTRAMUSCULAR | Status: DC | PRN
  Administered 2020-04-08: .2 mg via INTRAVENOUS

## 2020-04-08 MED ORDER — METHOCARBAMOL 1000 MG/10ML IJ SOLN
500.0000 mg | Freq: Four times a day (QID) | INTRAVENOUS | Status: DC | PRN
  Filled 2020-04-08: qty 5

## 2020-04-08 MED ORDER — MEPERIDINE HCL 50 MG/ML IJ SOLN: 6.2500 mg | INTRAMUSCULAR | Status: DC | PRN

## 2020-04-08 MED ORDER — DEXAMETHASONE SODIUM PHOSPHATE 10 MG/ML IJ SOLN
INTRAMUSCULAR | Status: DC | PRN
  Administered 2020-04-08: 5 mg via INTRAVENOUS

## 2020-04-08 MED ORDER — PROPOFOL 1000 MG/100ML IV EMUL
INTRAVENOUS | Status: AC
  Filled 2020-04-08: qty 100

## 2020-04-08 MED ORDER — PHENOL 1.4 % MT LIQD: 1.0000 | OROMUCOSAL | Status: DC | PRN

## 2020-04-08 MED ORDER — PROPOFOL 500 MG/50ML IV EMUL
INTRAVENOUS | Status: DC | PRN
  Administered 2020-04-08: 100 ug/kg/min via INTRAVENOUS

## 2020-04-08 MED ORDER — METHOCARBAMOL 500 MG PO TABS
500.0000 mg | ORAL_TABLET | Freq: Four times a day (QID) | ORAL | Status: DC | PRN
  Administered 2020-04-08 – 2020-04-11 (×5): 500 mg via ORAL
  Filled 2020-04-08 (×5): qty 1

## 2020-04-08 MED ORDER — ALBUMIN HUMAN 5 % IV SOLN
INTRAVENOUS | Status: AC
  Administered 2020-04-08: 12.5 g via INTRAVENOUS
  Filled 2020-04-08: qty 250

## 2020-04-08 MED ORDER — SODIUM CHLORIDE (PF) 0.9 % IJ SOLN
INTRAMUSCULAR | Status: DC | PRN
  Administered 2020-04-08: 30 mL

## 2020-04-08 MED ORDER — PHENYLEPHRINE HCL-NACL 10-0.9 MG/250ML-% IV SOLN
INTRAVENOUS | Status: DC | PRN
  Administered 2020-04-08: 50 ug/min via INTRAVENOUS

## 2020-04-08 MED ORDER — CEFAZOLIN SODIUM-DEXTROSE 2-4 GM/100ML-% IV SOLN
2.0000 g | Freq: Four times a day (QID) | INTRAVENOUS | Status: AC
  Administered 2020-04-08 – 2020-04-09 (×2): 2 g via INTRAVENOUS
  Filled 2020-04-08 (×2): qty 100

## 2020-04-08 MED ORDER — BUPIVACAINE IN DEXTROSE 0.75-8.25 % IT SOLN
INTRATHECAL | Status: DC | PRN
  Administered 2020-04-08: 2 mL via INTRATHECAL

## 2020-04-08 MED ORDER — CELECOXIB 200 MG PO CAPS
200.0000 mg | ORAL_CAPSULE | Freq: Two times a day (BID) | ORAL | Status: DC
  Administered 2020-04-08 – 2020-04-11 (×6): 200 mg via ORAL
  Filled 2020-04-08 (×6): qty 1

## 2020-04-08 MED ORDER — SENNA 8.6 MG PO TABS
1.0000 | ORAL_TABLET | Freq: Two times a day (BID) | ORAL | Status: DC
  Administered 2020-04-08 – 2020-04-11 (×6): 8.6 mg via ORAL
  Filled 2020-04-08 (×6): qty 1

## 2020-04-08 MED ORDER — ASPIRIN 81 MG PO CHEW
81.0000 mg | CHEWABLE_TABLET | Freq: Two times a day (BID) | ORAL | Status: DC
  Administered 2020-04-08 – 2020-04-11 (×6): 81 mg via ORAL
  Filled 2020-04-08 (×6): qty 1

## 2020-04-08 MED ORDER — BUPIVACAINE HCL (PF) 0.25 % IJ SOLN
INTRAMUSCULAR | Status: DC | PRN
  Administered 2020-04-08: 30 mL

## 2020-04-08 MED ORDER — KETOROLAC TROMETHAMINE 30 MG/ML IJ SOLN
INTRAMUSCULAR | Status: DC | PRN
  Administered 2020-04-08: 30 mg

## 2020-04-08 MED ORDER — ACETAMINOPHEN 325 MG PO TABS: 325.0000 mg | ORAL_TABLET | Freq: Four times a day (QID) | ORAL | Status: DC | PRN

## 2020-04-08 MED ORDER — ORAL CARE MOUTH RINSE: 15.0000 mL | Freq: Once | OROMUCOSAL | Status: AC

## 2020-04-08 MED ORDER — MIDAZOLAM HCL 5 MG/5ML IJ SOLN
INTRAMUSCULAR | Status: DC | PRN
  Administered 2020-04-08: 2 mg via INTRAVENOUS

## 2020-04-08 MED ORDER — BUPIVACAINE HCL 0.25 % IJ SOLN
INTRAMUSCULAR | Status: AC
  Filled 2020-04-08: qty 1

## 2020-04-08 MED ORDER — HYDROCODONE-ACETAMINOPHEN 5-325 MG PO TABS
1.0000 | ORAL_TABLET | ORAL | Status: DC | PRN
  Administered 2020-04-09: 1 via ORAL
  Administered 2020-04-09 – 2020-04-11 (×2): 2 via ORAL
  Filled 2020-04-08 (×3): qty 1
  Filled 2020-04-08: qty 2

## 2020-04-08 MED ORDER — ALBUMIN HUMAN 5 % IV SOLN: 12.5000 g | Freq: Once | INTRAVENOUS | Status: AC

## 2020-04-08 MED ORDER — HYDROMORPHONE HCL 1 MG/ML IJ SOLN: 0.2500 mg | INTRAMUSCULAR | Status: DC | PRN

## 2020-04-08 MED ORDER — FENTANYL CITRATE (PF) 100 MCG/2ML IJ SOLN
INTRAMUSCULAR | Status: DC | PRN
  Administered 2020-04-08: 75 ug via INTRAVENOUS
  Administered 2020-04-08: 25 ug via INTRAVENOUS

## 2020-04-08 MED ORDER — WATER FOR IRRIGATION, STERILE IR SOLN
Status: DC | PRN
  Administered 2020-04-08: 2000 mL

## 2020-04-08 MED ORDER — DEXAMETHASONE SODIUM PHOSPHATE 10 MG/ML IJ SOLN
10.0000 mg | Freq: Once | INTRAMUSCULAR | Status: AC
  Administered 2020-04-09: 10 mg via INTRAVENOUS
  Filled 2020-04-08: qty 1

## 2020-04-08 MED ORDER — ALUM & MAG HYDROXIDE-SIMETH 200-200-20 MG/5ML PO SUSP: 30.0000 mL | ORAL | Status: DC | PRN

## 2020-04-08 MED ORDER — TRANEXAMIC ACID-NACL 1000-0.7 MG/100ML-% IV SOLN
1000.0000 mg | INTRAVENOUS | Status: AC
  Administered 2020-04-08: 1000 mg via INTRAVENOUS
  Filled 2020-04-08: qty 100

## 2020-04-08 SURGICAL SUPPLY — 76 items
ADH SKN CLS APL DERMABOND .7 (GAUZE/BANDAGES/DRESSINGS) ×2
APL PRP STRL LF DISP 70% ISPRP (MISCELLANEOUS) ×1
BAG DECANTER FOR FLEXI CONT (MISCELLANEOUS) IMPLANT
BAG SPEC THK2 15X12 ZIP CLS (MISCELLANEOUS)
BAG ZIPLOCK 12X15 (MISCELLANEOUS) IMPLANT
BLADE SURG SZ10 CARB STEEL (BLADE) IMPLANT
CANISTER WOUND CARE 500ML ATS (WOUND CARE) ×2 IMPLANT
CHLORAPREP W/TINT 26 (MISCELLANEOUS) ×3 IMPLANT
COVER PERINEAL POST (MISCELLANEOUS) ×3 IMPLANT
COVER SURGICAL LIGHT HANDLE (MISCELLANEOUS) ×3 IMPLANT
COVER WAND RF STERILE (DRAPES) IMPLANT
CUP ACET PINNACLE SECTR 60MM (Hips) IMPLANT
DECANTER SPIKE VIAL GLASS SM (MISCELLANEOUS) ×3 IMPLANT
DERMABOND ADVANCED (GAUZE/BANDAGES/DRESSINGS) ×4
DERMABOND ADVANCED .7 DNX12 (GAUZE/BANDAGES/DRESSINGS) ×2 IMPLANT
DRAPE IMP U-DRAPE 54X76 (DRAPES) ×3 IMPLANT
DRAPE SHEET LG 3/4 BI-LAMINATE (DRAPES) ×9 IMPLANT
DRAPE STERI IOBAN 125X83 (DRAPES) IMPLANT
DRAPE U-SHAPE 47X51 STRL (DRAPES) ×6 IMPLANT
DRESSING PREVENA PLUS CUSTOM (GAUZE/BANDAGES/DRESSINGS) IMPLANT
DRSG AQUACEL AG ADV 3.5X10 (GAUZE/BANDAGES/DRESSINGS) ×3 IMPLANT
DRSG PREVENA PLUS CUSTOM (GAUZE/BANDAGES/DRESSINGS) ×3
ELECT REM PT RETURN 15FT ADLT (MISCELLANEOUS) ×3 IMPLANT
EVACUATOR DRAINAGE 10X20 100CC (DRAIN) IMPLANT
EVACUATOR SILICONE 100CC (DRAIN) ×3
GAUZE SPONGE 4X4 12PLY STRL (GAUZE/BANDAGES/DRESSINGS) ×3 IMPLANT
GLOVE BIO SURGEON STRL SZ8.5 (GLOVE) ×6 IMPLANT
GLOVE BIOGEL M STRL SZ7.5 (GLOVE) ×6 IMPLANT
GLOVE BIOGEL PI IND STRL 8 (GLOVE) ×1 IMPLANT
GLOVE BIOGEL PI IND STRL 8.5 (GLOVE) ×1 IMPLANT
GLOVE BIOGEL PI INDICATOR 8 (GLOVE) ×2
GLOVE BIOGEL PI INDICATOR 8.5 (GLOVE) ×2
GOWN SPEC L3 XXLG W/TWL (GOWN DISPOSABLE) ×3 IMPLANT
GOWN STRL REUS W/ TWL LRG LVL3 (GOWN DISPOSABLE) ×1 IMPLANT
GOWN STRL REUS W/TWL LRG LVL3 (GOWN DISPOSABLE) ×3
HANDPIECE INTERPULSE COAX TIP (DISPOSABLE) ×3
HEAD CERAMIC DELTA 36 PLUS 1.5 (Hips) ×2 IMPLANT
HOLDER FOLEY CATH W/STRAP (MISCELLANEOUS) ×3 IMPLANT
HOOD PEEL AWAY FLYTE STAYCOOL (MISCELLANEOUS) ×12 IMPLANT
JET LAVAGE IRRISEPT WOUND (IRRIGATION / IRRIGATOR) ×3
KIT DRSG PREVENA PLUS 7DAY 125 (MISCELLANEOUS) ×2 IMPLANT
KIT TURNOVER KIT A (KITS) IMPLANT
LAVAGE JET IRRISEPT WOUND (IRRIGATION / IRRIGATOR) ×1 IMPLANT
LINER PINN ALTRX ACTABR 36X60 (Liner) IMPLANT
LINER PINNACLE ALTRAX ACTABULR (Liner) ×3 IMPLANT
MANIFOLD NEPTUNE II (INSTRUMENTS) ×3 IMPLANT
MARKER SKIN DUAL TIP RULER LAB (MISCELLANEOUS) ×3 IMPLANT
NDL SAFETY ECLIPSE 18X1.5 (NEEDLE) ×1 IMPLANT
NDL SPNL 18GX3.5 QUINCKE PK (NEEDLE) ×1 IMPLANT
NEEDLE HYPO 18GX1.5 SHARP (NEEDLE) ×3
NEEDLE SPNL 18GX3.5 QUINCKE PK (NEEDLE) ×3 IMPLANT
PACK ANTERIOR HIP CUSTOM (KITS) ×3 IMPLANT
PENCIL SMOKE EVACUATOR (MISCELLANEOUS) IMPLANT
PINNSECTOR W/GRIP ACE CUP 60MM (Hips) ×3 IMPLANT
SAW OSC TIP CART 19.5X105X1.3 (SAW) ×3 IMPLANT
SEALER BIPOLAR AQUA 6.0 (INSTRUMENTS) ×3 IMPLANT
SET HNDPC FAN SPRY TIP SCT (DISPOSABLE) ×1 IMPLANT
STAPLER VISISTAT 35W (STAPLE) ×2 IMPLANT
STEM TRI LOC BPS GRIP SZ11 (Hips) IMPLANT
SUT ETHIBOND NAB CT1 #1 30IN (SUTURE) ×6 IMPLANT
SUT ETHILON 2 0 PS N (SUTURE) ×2 IMPLANT
SUT ETHILON 2 0 PSLX (SUTURE) ×4 IMPLANT
SUT MNCRL AB 3-0 PS2 18 (SUTURE) ×3 IMPLANT
SUT MNCRL AB 4-0 PS2 18 (SUTURE) ×3 IMPLANT
SUT MON AB 2-0 CT1 36 (SUTURE) ×10 IMPLANT
SUT STRATAFIX PDO 1 14 VIOLET (SUTURE) ×3
SUT STRATFX PDO 1 14 VIOLET (SUTURE) ×1
SUT VIC AB 0 CT1 36 (SUTURE) ×2 IMPLANT
SUT VIC AB 2-0 CT1 27 (SUTURE) ×3
SUT VIC AB 2-0 CT1 TAPERPNT 27 (SUTURE) ×1 IMPLANT
SUTURE STRATFX PDO 1 14 VIOLET (SUTURE) ×1 IMPLANT
SYR 3ML LL SCALE MARK (SYRINGE) ×3 IMPLANT
TRAY FOLEY MTR SLVR 16FR STAT (SET/KITS/TRAYS/PACK) IMPLANT
TRI LOC BPS W/GRIP SZ11 (Hips) ×3 IMPLANT
TUBE SUCTION HIGH CAP CLEAR NV (SUCTIONS) ×3 IMPLANT
WATER STERILE IRR 1000ML POUR (IV SOLUTION) ×3 IMPLANT

## 2020-04-08 NOTE — Interval H&P Note (Signed)
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  The various methods of treatment have been discussed with the patient and family. After consideration of risks, benefits and other options for treatment, the patient has consented to  Procedure(s): TOTAL HIP ARTHROPLASTY ANTERIOR APPROACH (Right) as a surgical intervention.  The patient's history has been reviewed, patient examined, no change in status, stable for surgery.  I have reviewed the patient's chart and labs.  Questions were answered to the patient's satisfaction.    The risks, benefits, and alternatives were discussed with the patient. There are risks associated with the surgery including, but not limited to, problems with anesthesia (death), infection, instability (giving out of the joint), dislocation, differences in leg length/angulation/rotation, fracture of bones, loosening or failure of implants, hematoma (blood accumulation) which may require surgical drainage, blood clots, pulmonary embolism, nerve injury (foot drop and lateral thigh numbness), and blood vessel injury. The patient understands these risks and elects to proceed.    Iline Oven Ragnar Waas

## 2020-04-08 NOTE — Evaluation (Signed)
Physical Therapy Evaluation Patient Details Name: James Bernard MRN: [id number] DOB: August 22, 1960 Today's Date: 04/08/2020   History of Present Illness  Patient is 59 y.o. male s/p RT THA anterior approach on 04/08/20 with PMH significant for OA, L4-5 back surgery.   Clinical Impression  James Bernard is a 59 y.o. male POD 0 s/p Rt THA. Patient reports independence with mobility at baseline but he has been greatly limited for last 4 weeks due to pain. Patient is now limited by functional impairments (see PT problem list below) and requires min assist for transfers and gait with RW and cues for safety throughout mobility. Patient was able to ambulate ~200 feet with RW and min assist. Patient instructed in exercise to facilitate circulation to manage edema and prevent DVT's. Patient will benefit from continued skilled PT interventions to address impairments and progress towards PLOF. Acute PT will follow to progress mobility and stair training in preparation for safe discharge home.     Follow Up Recommendations Follow surgeon's recommendation for DC plan and follow-up therapies    Equipment Recommendations  Rolling walker with 5" wheels;3in1 (PT) (bariatric (pt 157.9 kg) will need bari BSC as well )    Recommendations for Other Services       Precautions / Restrictions Precautions Precautions: Fall Precaution Comments: Wound Vac and JP drain Restrictions Weight Bearing Restrictions: No      Mobility  Bed Mobility Overal bed mobility: Needs Assistance Bed Mobility: Supine to Sit     Supine to sit: HOB elevated;Min assist     General bed mobility comments: cues for pt to use overhead bar and bed rail to assist with raising trunk. assist for lines and guarding for safety.    Transfers Overall transfer level: Needs assistance Equipment used: Rolling walker (2 wheeled) (bari) Transfers: Sit to/from Stand Sit to Stand: Min assist;From elevated surface         General  transfer comment: pt using momentum for power up despite cues for safe hand placement on RW and use of UE for power up. Min assist to steady walker and cues for safety required.  Ambulation/Gait Ambulation/Gait assistance: Min assist;Min guard Gait Distance (Feet): 200 Feet Assistive device: Rolling walker (2 wheeled) (bari) Gait Pattern/deviations: Step-through pattern;Decreased stride length;Wide base of support Gait velocity: fair   General Gait Details: Cues required for safety as pt is impulsive and eager to mobilize. Educated on safe proximitiy to RW and safe hand placement on RW. no overt LOB noted.  Stairs            Wheelchair Mobility    Modified Rankin (Stroke Patients Only)       Balance Overall balance assessment: Needs assistance Sitting-balance support: Feet supported Sitting balance-Leahy Scale: Good     Standing balance support: During functional activity;Bilateral upper extremity supported Standing balance-Leahy Scale: Fair                               Pertinent Vitals/Pain Pain Assessment: 0-10 Pain Score: 6  Pain Location: Rt hip Pain Descriptors / Indicators: Aching;Discomfort Pain Intervention(s): Limited activity within patient's tolerance;Monitored during session;Repositioned;Ice applied    Home Living Family/patient expects to be discharged to:: Private residence Living Arrangements: Spouse/significant other Available Help at Discharge: Family Type of Home: House Home Access: Stairs to enter Entrance Stairs-Rails: None Entrance Stairs-Number of Steps: 1 Home Layout: One level        Prior Function Level of  Independence: Independent         Comments: pt reports he has been limited to his bed and walking some in house with walker for last 4 weeks due to hip pain.     Hand Dominance   Dominant Hand: Right    Extremity/Trunk Assessment   Upper Extremity Assessment Upper Extremity Assessment: Overall WFL for tasks  assessed    Lower Extremity Assessment Lower Extremity Assessment: Overall WFL for tasks assessed    Cervical / Trunk Assessment Cervical / Trunk Assessment: Other exceptions Cervical / Trunk Exceptions: large body habitus  Communication   Communication: No difficulties  Cognition Arousal/Alertness: Awake/alert Behavior During Therapy: WFL for tasks assessed/performed Overall Cognitive Status: Within Functional Limits for tasks assessed                                        General Comments      Exercises Total Joint Exercises Ankle Circles/Pumps: Both;AROM;20 reps;Seated   Assessment/Plan    PT Assessment Patient needs continued PT services  PT Problem List Decreased strength;Decreased activity tolerance;Decreased range of motion;Decreased balance;Decreased mobility;Decreased knowledge of use of DME;Decreased knowledge of precautions;Decreased safety awareness;Obesity       PT Treatment Interventions DME instruction;Gait training;Stair training;Functional mobility training;Therapeutic activities;Therapeutic exercise;Balance training;Patient/family education    PT Goals (Current goals can be found in the Care Plan section)  Acute Rehab PT Goals Patient Stated Goal: get back to coaching PT Goal Formulation: With patient Time For Goal Achievement: 04/15/20 Potential to Achieve Goals: Good    Frequency 7X/week   Barriers to discharge        Co-evaluation               AM-PAC PT "6 Clicks" Mobility  Outcome Measure Help needed turning from your back to your side while in a flat bed without using bedrails?: A Little Help needed moving from lying on your back to sitting on the side of a flat bed without using bedrails?: A Little Help needed moving to and from a bed to a chair (including a wheelchair)?: A Little Help needed standing up from a chair using your arms (e.g., wheelchair or bedside chair)?: A Little Help needed to walk in hospital  room?: A Little Help needed climbing 3-5 steps with a railing? : A Little 6 Click Score: 18    End of Session Equipment Utilized During Treatment: Gait belt Activity Tolerance: Patient tolerated treatment well Patient left: in chair;with call bell/phone within reach;with chair alarm set;with family/visitor present Nurse Communication: Mobility status;Patient requests pain meds PT Visit Diagnosis: Muscle weakness (generalized) (M62.81);Difficulty in walking, not elsewhere classified (R26.2)    Time: 0932-6712 PT Time Calculation (min) (ACUTE ONLY): 34 min   Charges:   PT Evaluation $PT Eval Low Complexity: 1 Low PT Treatments $Gait Training: 8-22 mins        Wynn Maudlin, DPT Acute Rehabilitation Services  Office [phone] Pager [phone]  04/08/2020 7:18 PM

## 2020-04-08 NOTE — Anesthesia Procedure Notes (Signed)
Spinal  Patient location during procedure: OR Start time: 04/08/2020 12:10 PM End time: 04/08/2020 12:12 PM Staffing Performed: anesthesiologist  Anesthesiologist: Leilani Able, MD Preanesthetic Checklist Completed: patient identified, IV checked, site marked, risks and benefits discussed, surgical consent, monitors and equipment checked, pre-op evaluation and timeout performed Spinal Block Patient position: sitting Prep: DuraPrep and site prepped and draped Patient monitoring: continuous pulse ox and blood pressure Approach: midline Location: L3-4 Injection technique: single-shot Needle Needle type: Pencan  Needle gauge: 24 G Needle length: 10 cm Needle insertion depth: 8 cm Assessment Sensory level: T8

## 2020-04-08 NOTE — Anesthesia Preprocedure Evaluation (Signed)
Anesthesia Evaluation  Patient identified by MRN, date of birth, ID band Patient awake    Reviewed: Allergy & Precautions, NPO status , Patient's Chart, lab work & pertinent test results  Airway Mallampati: II       Dental no notable dental hx.    Pulmonary former smoker,    Pulmonary exam normal        Cardiovascular hypertension, Normal cardiovascular exam     Neuro/Psych negative neurological ROS  negative psych ROS   GI/Hepatic negative GI ROS, Neg liver ROS,   Endo/Other  Morbid obesity  Renal/GU negative Renal ROS  negative genitourinary   Musculoskeletal  (+) Arthritis , Osteoarthritis,    Abdominal (+) + obese,   Peds  Hematology negative hematology ROS (+)   Anesthesia Other Findings   Reproductive/Obstetrics                             Anesthesia Physical Anesthesia Plan  ASA: III  Anesthesia Plan: Spinal   Post-op Pain Management:    Induction:   PONV Risk Score and Plan: 2 and Ondansetron, Dexamethasone and Midazolam  Airway Management Planned: Natural Airway and Simple Face Mask  Additional Equipment: None  Intra-op Plan:   Post-operative Plan:   Informed Consent: I have reviewed the patients History and Physical, chart, labs and discussed the procedure including the risks, benefits and alternatives for the proposed anesthesia with the patient or authorized representative who has indicated his/her understanding and acceptance.       Plan Discussed with: CRNA  Anesthesia Plan Comments:         Anesthesia Quick Evaluation

## 2020-04-08 NOTE — Op Note (Signed)
OPERATIVE REPORT  SURGEON: Rod Can, MD   ASSISTANT: Cherlynn June, PA-C.  PREOPERATIVE DIAGNOSIS: Right hip arthritis.   POSTOPERATIVE DIAGNOSIS: Right hip arthritis.   PROCEDURE: Right total hip arthroplasty, anterior approach.  Application of negative pressure incisional vacuum dressing.  IMPLANTS: DePuy Tri Lock stem, size 11, hi offset. DePuy Pinnacle Cup, size 60 mm. DePuy Altrx liner, size 36 by 60 mm, neutral. DePuy Biolox ceramic head ball, size 36 + 1.5 mm.  ANESTHESIA:  MAC and Spinal  ESTIMATED BLOOD LOSS:-200 mL    ANTIBIOTICS: 3 g Ancef.  DRAINS: 10 mm JP drain in subcutaneous tissue x1.  COMPLICATIONS: None.   CONDITION: PACU - hemodynamically stable.   BRIEF CLINICAL NOTE: James Bernard is a 59 y.o. male with a long-standing history of Right hip arthritis.  About last couple of months, x-rays did show rapid progression of his right hip osteoarthritis with collapse of the femoral head and mild acetabular bone loss.  His pain became extremely severe, and he has been confined to a wheelchair.  He has been in the process of losing weight.  We decided to proceed with surgery due to his rapid loss of mobility.  After failing conservative management, the patient was indicated for total hip arthroplasty. The risks, benefits, and alternatives to the procedure were explained, and the patient elected to proceed.  PROCEDURE IN DETAIL: Surgical site was marked by myself in the pre-op holding area. Once inside the operating room, spinal anesthesia was obtained, and a foley catheter was inserted. The patient was then positioned on the Hana table.  All bony prominences were well padded.  The hip was prepped and draped in the normal sterile surgical fashion.  A time-out was called verifying side and site of surgery. The patient received IV antibiotics within 60 minutes of beginning the procedure.   The direct anterior approach to the hip was performed through the  Hueter interval.  Lateral femoral circumflex vessels were treated with the Auqumantys. The anterior capsule was exposed and an inverted T capsulotomy was made. The femoral neck cut was made to the level of the templated cut.  A corkscrew was placed into the head and the head was removed.  The femoral head was found to have eburnated bone. The head was passed to the back table and was measured.   Acetabular exposure was achieved, and the pulvinar and labrum were excised. Sequential reaming of the acetabulum was then performed up to a size 59 mm reamer. A 60 mm cup was then opened and impacted into place at approximately 40 degrees of abduction and 20 degrees of anteversion. The final polyethylene liner was impacted into place and acetabular osteophytes were removed.    I then gained femoral exposure taking care to protect the abductors and greater trochanter.  This was performed using standard external rotation, extension, and adduction.  The capsule was peeled off the inner aspect of the greater trochanter, taking care to preserve the short external rotators. A cookie cutter was used to enter the femoral canal, and then the femoral canal finder was placed.  Sequential broaching was performed up to a size 11.  Calcar planer was used on the femoral neck remnant.  I placed a hi offset neck and a trial head ball.  The hip was reduced.  Leg lengths and offset were checked fluoroscopically.  The hip was dislocated and trial components were removed.  The final implants were placed, and the hip was reduced.  Fluoroscopy was used to confirm  component position and leg lengths.  At 90 degrees of external rotation and full extension, the hip was stable to an anterior directed force.   The wound was copiously irrigated with Irrisept solution and normal saline using pule lavage.  Marcaine solution was injected into the periarticular soft tissue.   A separate stab incision was made in line with the incision distally.  A 10  mm flat JP drain was placed in the deep subcutaneous tissue.  The drain was sewn in with 2-0 nylon suture.  The wound was closed in layers using #1 Stratafix for the fascia, 0 Vicryl for the deep fat, 2-0 Vicryl for the subcutaneous fat, 2-0 Monocryl for the deep dermal layer, 3-0 running Monocryl subcuticular stitch, and a combination of 2-0 vertical mattress sutures and staples for the skin.  A customizable Prevena incisional negative pressure dressing was applied according to manufacturer's instructions.  Suction was hooked up to 125 mmHg.  There was no leak. The patient was transported to the recovery room in stable condition.  Sponge, needle, and instrument counts were correct at the end of the case x2.  The patient tolerated the procedure well and there were no known complications.  Please note that a surgical assistant was a medical necessity for this procedure to perform it in a safe and expeditious manner. Assistant was necessary to provide appropriate retraction of vital neurovascular structures, to prevent femoral fracture, and to allow for anatomic placement of the prosthesis.

## 2020-04-08 NOTE — Anesthesia Postprocedure Evaluation (Signed)
Anesthesia Post Note  Patient: SAMIT SYLVE  Procedure(s) Performed: TOTAL HIP ARTHROPLASTY ANTERIOR APPROACH (Right Hip)     Patient location during evaluation: PACU Anesthesia Type: Spinal Level of consciousness: awake and alert Pain management: pain level controlled Vital Signs Assessment: post-procedure vital signs reviewed and stable Respiratory status: spontaneous breathing, nonlabored ventilation and respiratory function stable Cardiovascular status: blood pressure returned to baseline and stable Postop Assessment: no apparent nausea or vomiting, spinal receding, no headache, no backache and patient able to bend at knees Anesthetic complications: no   No complications documented.  Last Vitals:  Vitals:   04/08/20 1545 04/08/20 1600  BP: (!) 89/63 94/63  Pulse: 63   Resp: 17 18  Temp:    SpO2: (!) 79%     Last Pain:  Vitals:   04/08/20 0924  TempSrc:   PainSc: 10-Worst pain ever                 Lucretia Kern

## 2020-04-08 NOTE — Progress Notes (Signed)
Orthopedic Tech Progress Note Patient Details:  THARON BOMAR [birth year] [id number]  Ortho Devices Ortho Device/Splint Location: applied overhead frame to bed Ortho Device/Splint Interventions: Ordered, Application   Post Interventions Patient Tolerated: Well Instructions Provided: Care of device   Jennye Moccasin 04/08/2020, 5:37 PM

## 2020-04-08 NOTE — Transfer of Care (Signed)
Immediate Anesthesia Transfer of Care Note  Patient: James Bernard  Procedure(s) Performed: TOTAL HIP ARTHROPLASTY ANTERIOR APPROACH (Right Hip)  Patient Location: PACU  Anesthesia Type:Spinal  Level of Consciousness: awake, alert , oriented and patient cooperative  Airway & Oxygen Therapy: Patient Spontanous Breathing and Patient connected to face mask oxygen  Post-op Assessment: Report given to RN and Post -op Vital signs reviewed and stable  Post vital signs: Reviewed and stable  Last Vitals:  Vitals Value Taken Time  BP    Temp    Pulse 69 04/08/20 1533  Resp 19 04/08/20 1533  SpO2 95 % 04/08/20 1533  Vitals shown include unvalidated device data.  Last Pain:  Vitals:   04/08/20 0924  TempSrc:   PainSc: 10-Worst pain ever      Patients Stated Pain Goal: 8 (04/08/20 0924)  Complications: No complications documented.

## 2020-04-09 ENCOUNTER — Encounter (HOSPITAL_COMMUNITY): Payer: Self-pay | Admitting: Orthopedic Surgery

## 2020-04-09 DIAGNOSIS — M1611 Unilateral primary osteoarthritis, right hip: Secondary | ICD-10-CM | POA: Diagnosis not present

## 2020-04-09 LAB — BASIC METABOLIC PANEL
Anion gap: 9 (ref 5–15)
BUN: 18 mg/dL (ref 8–23)
CO2: 26 mmol/L (ref 22–32)
Calcium: 8.5 mg/dL — ABNORMAL LOW (ref 8.9–10.3)
Chloride: 99 mmol/L (ref 98–111)
Creatinine, Ser: 0.81 mg/dL (ref 0.61–1.24)
GFR, Estimated: >60 mL/min (ref >60)
Glucose, Bld: 152 mg/dL — ABNORMAL HIGH (ref 70–99)
Potassium: 4.8 mmol/L (ref 3.5–5.1)
Sodium: 134 mmol/L — ABNORMAL LOW (ref 135–145)

## 2020-04-09 LAB — CBC
HCT: 37.6 % — ABNORMAL LOW (ref 39.0–52.0)
Hemoglobin: 11.7 g/dL — ABNORMAL LOW (ref 13.0–17.0)
MCH: 28.1 pg (ref 26.0–34.0)
MCHC: 31.1 g/dL (ref 30.0–36.0)
MCV: 90.2 fL (ref 80.0–100.0)
Platelets: 270 10*3/uL (ref 150–400)
RBC: 4.17 MIL/uL — ABNORMAL LOW (ref 4.22–5.81)
RDW: 13.9 % (ref 11.5–15.5)
WBC: 13.9 10*3/uL — ABNORMAL HIGH (ref 4.0–10.5)
nRBC: 0 % (ref 0.0–0.2)

## 2020-04-09 MED ORDER — SENNA 8.6 MG PO TABS: 2.0000 | ORAL_TABLET | Freq: Every day | ORAL | 1 refills | Status: AC

## 2020-04-09 MED ORDER — HYDROCODONE-ACETAMINOPHEN 5-325 MG PO TABS: 1.0000 | ORAL_TABLET | ORAL | 0 refills | Status: AC | PRN

## 2020-04-09 MED ORDER — DOCUSATE SODIUM 100 MG PO CAPS: 100.0000 mg | ORAL_CAPSULE | Freq: Two times a day (BID) | ORAL | 1 refills | Status: AC

## 2020-04-09 MED ORDER — ASPIRIN 81 MG PO CHEW: 81.0000 mg | CHEWABLE_TABLET | Freq: Two times a day (BID) | ORAL | 0 refills | Status: AC

## 2020-04-09 MED ORDER — ONDANSETRON HCL 4 MG PO TABS: 4.0000 mg | ORAL_TABLET | Freq: Four times a day (QID) | ORAL | 0 refills | Status: AC | PRN

## 2020-04-09 NOTE — Progress Notes (Signed)
Verbal orders given by Samson Frederic, MD to remove foley catheter.

## 2020-04-09 NOTE — Progress Notes (Signed)
Physical Therapy Treatment Patient Details Name: James Bernard MRN: [id number] DOB: 1961/05/25 Today's Date: 04/09/2020    History of Present Illness Patient is 59 y.o. male s/p RT THA anterior approach on 04/08/20 with PMH significant for OA, L4-5 back surgery.     PT Comments    Patient progressing well with acute PT and demonstrated good carryover for safe use of RW during gait. He required cues for technique with power up to RW at start of session and min guard for safety. He was limited some by pain and gait velocity slightly decreased from yesterday. Patient instructed on use of belt to assist with Rt LE mobility in bed and on supine exercises for HEP. He will continue to benefit from skilled PT interventions to address impairments and progress independence with mobility and HEP.   Follow Up Recommendations  Follow surgeon's recommendation for DC plan and follow-up therapies     Equipment Recommendations  Rolling walker with 5" wheels;3in1 (PT) (bariatric (pt 157.9 kg) will need bari BSC as well )    Recommendations for Other Services       Precautions / Restrictions Precautions Precautions: Fall Precaution Comments: Wound Vac and JP drain Restrictions Weight Bearing Restrictions: No    Mobility  Bed Mobility Overal bed mobility: Needs Assistance Bed Mobility: Sit to Supine       Sit to supine: Min guard;HOB elevated   General bed mobility comments: guard for safety, pt educated on use of belt for Rt LE mobility to raise onto bed. Assist for line management.  Transfers Overall transfer level: Needs assistance Equipment used: Rolling walker (2 wheeled) (bari) Transfers: Sit to/from Stand Sit to Stand: Min guard;From elevated surface         General transfer comment: cues for hand placement on arm rest for power up from recliner. min guard for safety with rise, pt steady in standing.   Ambulation/Gait Ambulation/Gait assistance: Min guard Gait Distance  (Feet): 160 Feet Assistive device: Rolling walker (2 wheeled) (bari) Gait Pattern/deviations: Step-through pattern;Decreased stride length;Wide base of support;Decreased weight shift to right Gait velocity: decr   General Gait Details: Pt maintained safe hand placement on RW and safe proximity throughout. decreased gait speed today due to increased hip pain. no overt LOB noted. pt took 2 standing breaks due to fatigue/pain.    Stairs Stairs: Yes Stairs assistance: Min assist Stair Management: No rails;Step to pattern;Backwards;With walker Number of Stairs: 1 General stair comments: VC's for safe step sequencing "up with good, down with bad" to complete reverse step up with RW onto 6" curb. Assist to stabilize RW.   Wheelchair Mobility    Modified Rankin (Stroke Patients Only)       Balance Overall balance assessment: Needs assistance Sitting-balance support: Feet supported Sitting balance-Leahy Scale: Good     Standing balance support: During functional activity;Bilateral upper extremity supported Standing balance-Leahy Scale: Fair                              Cognition Arousal/Alertness: Awake/alert Behavior During Therapy: WFL for tasks assessed/performed Overall Cognitive Status: Within Functional Limits for tasks assessed                                        Exercises Total Joint Exercises Ankle Circles/Pumps: Both;AROM;20 reps;Seated Quad Sets: AROM;Right;10 reps;Supine Heel Slides: AROM;Right;10 reps;Supine  General Comments        Pertinent Vitals/Pain Pain Assessment: Faces Faces Pain Scale: Hurts even more Pain Location: Rt hip Pain Descriptors / Indicators: Aching;Discomfort Pain Intervention(s): Limited activity within patient's tolerance;Monitored during session;Repositioned    Home Living                      Prior Function            PT Goals (current goals can now be found in the care plan section)  Acute Rehab PT Goals Patient Stated Goal: get back to coaching PT Goal Formulation: With patient Time For Goal Achievement: 04/15/20 Potential to Achieve Goals: Good Progress towards PT goals: Progressing toward goals    Frequency    7X/week      PT Plan Current plan remains appropriate    Co-evaluation              AM-PAC PT "6 Clicks" Mobility   Outcome Measure  Help needed turning from your back to your side while in a flat bed without using bedrails?: A Little Help needed moving from lying on your back to sitting on the side of a flat bed without using bedrails?: A Little Help needed moving to and from a bed to a chair (including a wheelchair)?: A Little Help needed standing up from a chair using your arms (e.g., wheelchair or bedside chair)?: A Little Help needed to walk in hospital room?: A Little Help needed climbing 3-5 steps with a railing? : A Little 6 Click Score: 18    End of Session Equipment Utilized During Treatment: Gait belt Activity Tolerance: Patient tolerated treatment well Patient left: in chair;with call bell/phone within reach;with chair alarm set;with family/visitor present Nurse Communication: Mobility status;Patient requests pain meds PT Visit Diagnosis: Muscle weakness (generalized) (M62.81);Difficulty in walking, not elsewhere classified (R26.2)     Time: 1245-8099 PT Time Calculation (min) (ACUTE ONLY): 39 min  Charges:  $Gait Training: 8-22 mins $Therapeutic Exercise: 8-22 mins $Therapeutic Activity: 8-22 mins                    Wynn Maudlin, DPT Acute Rehabilitation Services  Office [phone] Pager [phone]  04/09/2020 9:29 AM

## 2020-04-09 NOTE — Progress Notes (Signed)
    Subjective:  Patient reports pain as mild to moderate.  Denies N/V/CP/SOB.   Objective:   VITALS:   Vitals:   04/08/20 2016 04/09/20 0150 04/09/20 0534 04/09/20 0947  BP: 128/85 132/81 139/83 140/79  Pulse: 92 94 83 85  Resp: 18 20 18 20   Temp: 98.8 F (37.1 C) 97.8 F (36.6 C) 98 F (36.7 C) 98.3 F (36.8 C)  TempSrc:    Oral  SpO2: 96% 94% 95% 96%  Weight:      Height:       JP: > 300 since surgery  NAD ABD soft Sensation intact distally Intact pulses distally Dorsiflexion/Plantar flexion intact Incision: dressing C/D/I Compartment soft iVAC intact without leak  Lab Results  Component Value Date   WBC 13.9 (H) 04/09/2020   HGB 11.7 (L) 04/09/2020   HCT 37.6 (L) 04/09/2020   MCV 90.2 04/09/2020   PLT 270 04/09/2020   BMET    Component Value Date/Time   NA 134 (L) 04/09/2020 0309   K 4.8 04/09/2020 0309   CL 99 04/09/2020 0309   CO2 26 04/09/2020 0309   GLUCOSE 152 (H) 04/09/2020 0309   BUN 18 04/09/2020 0309   CREATININE 0.81 04/09/2020 0309   CALCIUM 8.5 (L) 04/09/2020 0309   GFRNONAA >60 04/09/2020 0309     Assessment/Plan: 1 Day Post-Op   Principal Problem:   Osteoarthritis of right hip Active Problems:   Primary osteoarthritis of right hip   WBAT with walker DVT ppx: Aspirin, SCDs, TEDS PO pain control PT/OT Dispo: cont PT, monitor drain output o/n, probable d/c home tomorrow, convert house VAC to Prevena suction unit on d.c    13/03/2020 Mosella Kasa 04/09/2020, 10:23 AM   13/03/2020, MD [phone] [facility] is now [facility]  [place] [address], [place], [place] Phone: [phone] [email or web address] Facebook  [phone]

## 2020-04-09 NOTE — Progress Notes (Signed)
Physical Therapy Treatment Patient Details Name: James Bernard MRN: [id number] DOB: 14-Jul-1960 Today's Date: 04/09/2020    History of Present Illness Patient is 59 y.o. male s/p RT THA anterior approach on 04/08/20 with PMH significant for OA, L4-5 back surgery.     PT Comments    Pt continues very motivated but cued multiple times to slow down for safety.  Pt ambulated increased distance in hall and progressed with HEP.  Pt hopeful for dc home tomorrow.   Follow Up Recommendations  Follow surgeon's recommendation for DC plan and follow-up therapies     Equipment Recommendations  Rolling walker with 5" wheels;3in1 (PT)    Recommendations for Other Services       Precautions / Restrictions Precautions Precautions: Fall Precaution Comments: Wound Vac and JP drain Restrictions Weight Bearing Restrictions: No    Mobility  Bed Mobility Overal bed mobility: Needs Assistance Bed Mobility: Supine to Sit;Sit to Supine     Supine to sit: Min guard Sit to supine: Min guard   General bed mobility comments: cues for sequence; use of gait belt to self-assist  Transfers Overall transfer level: Needs assistance Equipment used: Rolling walker (2 wheeled) Transfers: Sit to/from Stand Sit to Stand: Min guard;From elevated surface         General transfer comment: cues for use of UEs to self assist  Ambulation/Gait Ambulation/Gait assistance: Min guard Gait Distance (Feet): 350 Feet Assistive device: Rolling walker (2 wheeled) Gait Pattern/deviations: Step-through pattern;Decreased stride length;Wide base of support;Decreased weight shift to right Gait velocity: decr   General Gait Details: Pt maintained safe hand placement on RW and safe proximity throughout. decreased gait speed today due to increased hip pain. no overt LOB noted. pt took 2 standing breaks due to fatigue/pain.    Stairs             Wheelchair Mobility    Modified Rankin (Stroke Patients Only)        Balance Overall balance assessment: Needs assistance Sitting-balance support: Feet supported Sitting balance-Leahy Scale: Good     Standing balance support: During functional activity;Bilateral upper extremity supported Standing balance-Leahy Scale: Fair                              Cognition Arousal/Alertness: Awake/alert Behavior During Therapy: WFL for tasks assessed/performed Overall Cognitive Status: Within Functional Limits for tasks assessed                                        Exercises Total Joint Exercises Ankle Circles/Pumps: Both;AROM;20 reps;Seated Quad Sets: AROM;Right;10 reps;Supine Heel Slides: AROM;Right;10 reps;Supine Hip ABduction/ADduction: AAROM;Right;15 reps;Supine Long Arc Quad: AROM;Right;10 reps;Seated    General Comments        Pertinent Vitals/Pain Pain Assessment: 0-10 Pain Score: 5  Pain Location: Rt hip Pain Descriptors / Indicators: Aching;Discomfort Pain Intervention(s): Limited activity within patient's tolerance;Monitored during session;Premedicated before session;Ice applied    Home Living                      Prior Function            PT Goals (current goals can now be found in the care plan section) Acute Rehab PT Goals Patient Stated Goal: get back to coaching PT Goal Formulation: With patient Time For Goal Achievement: 04/15/20 Potential to Achieve Goals: Good Progress  towards PT goals: Progressing toward goals    Frequency    7X/week      PT Plan Current plan remains appropriate    Co-evaluation              AM-PAC PT "6 Clicks" Mobility   Outcome Measure  Help needed turning from your back to your side while in a flat bed without using bedrails?: A Little Help needed moving from lying on your back to sitting on the side of a flat bed without using bedrails?: A Little Help needed moving to and from a bed to a chair (including a wheelchair)?: A Little Help  needed standing up from a chair using your arms (e.g., wheelchair or bedside chair)?: A Little Help needed to walk in hospital room?: A Little Help needed climbing 3-5 steps with a railing? : A Little 6 Click Score: 18    End of Session Equipment Utilized During Treatment: Gait belt Activity Tolerance: Patient tolerated treatment well Patient left: in bed;with call bell/phone within reach;with nursing/sitter in room;with family/visitor present Nurse Communication: Mobility status PT Visit Diagnosis: Muscle weakness (generalized) (M62.81);Difficulty in walking, not elsewhere classified (R26.2)     Time: 1330-1430 PT Time Calculation (min) (ACUTE ONLY): 60 min  Charges:  $Gait Training: 8-22 mins $Therapeutic Exercise: 8-22 mins $Therapeutic Activity: 8-22 mins                     Mauro Kaufmann PT Acute Rehabilitation Services Pager [phone] Office [phone]    James Bernard 04/09/2020, 3:46 PM

## 2020-04-09 NOTE — Discharge Summary (Addendum)
Physician Discharge Summary  Patient ID: James Bernard MRN: [id number] DOB/AGE: 1960/07/25 59 y.o.  Admit date: 04/08/2020 Discharge date: 04/11/2020  Admission Diagnoses:  Osteoarthritis of right hip  Discharge Diagnoses:  Principal Problem:   Osteoarthritis of right hip Active Problems:   Primary osteoarthritis of right hip   Past Medical History:  Diagnosis Date   Arthritis    hands, shoulders    Surgeries: Procedure(s): TOTAL HIP ARTHROPLASTY ANTERIOR APPROACH on 04/08/2020   Consultants (if any):    Discharged Condition: Improved  Hospital Course: KOLSON CHOVANEC is an 59 y.o. male who was admitted 04/08/2020 with a diagnosis of Osteoarthritis of right hip and went to the operating room on 04/08/2020 and underwent the above named procedures.    He was given perioperative antibiotics:  Anti-infectives (From admission, onward)    Start     Dose/Rate Route Frequency Ordered Stop   04/08/20 1800  ceFAZolin (ANCEF) IVPB 2g/100 mL premix        2 g 200 mL/hr over 30 Minutes Intravenous Every 6 hours 04/08/20 1720 04/09/20 0115   04/08/20 0600  ceFAZolin (ANCEF) 3 g in dextrose 5 % 50 mL IVPB        3 g 100 mL/hr over 30 Minutes Intravenous On call to O.R. 04/07/20 9390 04/08/20 1202     .  Postoperatively, his JP drain output was monitored. Incisional VAC dressing remained with no output.   He was given sequential compression devices, early ambulation, and aspirin for DVT prophylaxis.  He benefited maximally from the hospital stay and there were no complications.    Recent vital signs:  Vitals:   04/11/20 0555 04/11/20 1519  BP: 131/88 118/85  Pulse: 71 79  Resp:  18  Temp:    SpO2:  96%    Recent laboratory studies:  Lab Results  Component Value Date   HGB 10.6 (L) 04/11/2020   HGB 11.3 (L) 04/10/2020   HGB 11.7 (L) 04/09/2020   Lab Results  Component Value Date   WBC 9.6 04/11/2020   PLT 223 04/11/2020   Lab Results  Component Value Date    INR 1.0 03/31/2020   Lab Results  Component Value Date   NA 134 (L) 04/09/2020   K 4.8 04/09/2020   CL 99 04/09/2020   CO2 26 04/09/2020   BUN 18 04/09/2020   CREATININE 0.81 04/09/2020   GLUCOSE 152 (H) 04/09/2020    Discharge Medications:   Allergies as of 04/11/2020   No Known Allergies      Medication List     STOP taking these medications    acetaminophen 500 MG tablet Commonly known as: TYLENOL   traMADol 50 MG tablet Commonly known as: ULTRAM       TAKE these medications    aspirin 81 MG chewable tablet Chew 1 tablet (81 mg total) by mouth 2 (two) times daily.   diclofenac 75 MG EC tablet Commonly known as: VOLTAREN Take 75 mg by mouth 2 (two) times daily.   docusate sodium 100 MG capsule Commonly known as: COLACE Take 1 capsule (100 mg total) by mouth 2 (two) times daily.   HYDROcodone-acetaminophen 5-325 MG tablet Commonly known as: NORCO/VICODIN Take 1 tablet by mouth every 4 (four) hours as needed for moderate pain (pain score 4-6).   ondansetron 4 MG tablet Commonly known as: ZOFRAN Take 1 tablet (4 mg total) by mouth every 6 (six) hours as needed for nausea.   senna 8.6 MG Tabs tablet Commonly  known as: SENOKOT Take 2 tablets (17.2 mg total) by mouth at bedtime.        Diagnostic Studies: DG Pelvis Portable  Result Date: 04/08/2020 CLINICAL DATA:  Status post right hip arthroplasty EXAM: PORTABLE PELVIS 1-2 VIEWS COMPARISON:  March 06, 2020; intraoperative right hip images April 08, 2020. FINDINGS: Frontal view of mid and lower pelvis and bilateral hips obtained. There are total hip replacements bilaterally with prosthetic components bilaterally appearing well seated on frontal view. No fracture or dislocation. There is mild soft tissue air on the right consistent with recent surgery. IMPRESSION: Total hip replacements on the right with prosthetic components bilaterally well-seated on frontal view. No fracture or dislocation. Acute  postoperative change noted on the right. Electronically Signed   By: Bretta Bang III M.D.   On: 04/08/2020 17:00   DG C-Arm 1-60 Min-No Report  Result Date: 04/08/2020 Fluoroscopy was utilized by the requesting physician.  No radiographic interpretation.   DG HIP OPERATIVE UNILAT W OR W/O PELVIS RIGHT  Result Date: 04/08/2020 CLINICAL DATA:  Intraoperative imaging for right hip replacement. EXAM: OPERATIVE RIGHT HIP (WITH PELVIS IF PERFORMED) 6 VIEWS TECHNIQUE: Fluoroscopic spot image(s) were submitted for interpretation post-operatively. COMPARISON:  Plain films right hip 03/06/2020. FINDINGS: Provided series of intraoperative fluoroscopic spot views demonstrates placement of a right total hip arthroplasty. No acute abnormality. IMPRESSION: Intraoperative imaging for right hip replacement.  No acute finding. Electronically Signed   By: Drusilla Kanner M.D.   On: 04/08/2020 14:49     Disposition: Discharge disposition: 01-Home or Self Care      Discharge Instructions     Call MD / Call 911   Complete by: As directed    If you experience chest pain or shortness of breath, CALL 911 and be transported to the hospital emergency room.  If you develope a fever above 101 F, pus (white drainage) or increased drainage or redness at the wound, or calf pain, call your surgeon's office.   Constipation Prevention   Complete by: As directed    Drink plenty of fluids.  Prune juice may be helpful.  You may use a stool softener, such as Colace (over the counter) 100 mg twice a day.  Use MiraLax (over the counter) for constipation as needed.   Diet - low sodium heart healthy   Complete by: As directed    Discharge instructions   Complete by: As directed    Empty the JP drain every 8 hours. The drain and vacuum will be removed at your first follow up appointment.   Discharge patient   Complete by: As directed    Discharge disposition: 01-Home or Self Care   Discharge patient date: 04/11/2020    Driving restrictions   Complete by: As directed    No driving for 6 weeks   Follow the hip precautions as taught in Physical Therapy   Complete by: As directed    Increase activity slowly as tolerated   Complete by: As directed    Lifting restrictions   Complete by: As directed    No lifting for 6 weeks   TED hose   Complete by: As directed    Use stockings (TED hose) for 2 weeks on both leg(s).  You may remove them at night for sleeping.        Follow-up Information     Ermelinda Eckert, Arlys John, MD. Schedule an appointment as soon as possible for a visit on 04/14/2020.   Specialty: Orthopedic Surgery Why: For wound  re-check, VAC removal Contact information: [address] [place] [address] [phone]                  Signed: Iline Oven Sufyan Meidinger 04/13/2020, 8:10 AM

## 2020-04-10 DIAGNOSIS — M1611 Unilateral primary osteoarthritis, right hip: Secondary | ICD-10-CM | POA: Diagnosis not present

## 2020-04-10 LAB — CBC
HCT: 37.1 % — ABNORMAL LOW (ref 39.0–52.0)
Hemoglobin: 11.3 g/dL — ABNORMAL LOW (ref 13.0–17.0)
MCH: 27.8 pg (ref 26.0–34.0)
MCHC: 30.5 g/dL (ref 30.0–36.0)
MCV: 91.2 fL (ref 80.0–100.0)
Platelets: 231 10*3/uL (ref 150–400)
RBC: 4.07 MIL/uL — ABNORMAL LOW (ref 4.22–5.81)
RDW: 14.1 % (ref 11.5–15.5)
WBC: 11.2 10*3/uL — ABNORMAL HIGH (ref 4.0–10.5)
nRBC: 0 % (ref 0.0–0.2)

## 2020-04-10 NOTE — Progress Notes (Signed)
Physical Therapy Treatment Patient Details Name: James Bernard MRN: [id number] DOB: 01-May-1961 Today's Date: 04/10/2020    History of Present Illness Patient is 59 y.o. male s/p RT THA anterior approach on 04/08/20 with PMH significant for OA, L4-5 back surgery.     PT Comments    Pt performed HEP with assist and increased time.  Written instruction provided and reviewed.   Follow Up Recommendations  Follow surgeon's recommendation for DC plan and follow-up therapies     Equipment Recommendations  Rolling walker with 5" wheels;3in1 (PT)    Recommendations for Other Services       Precautions / Restrictions Precautions Precautions: Fall Precaution Comments: Wound Vac and JP drain Restrictions Weight Bearing Restrictions: No    Mobility  Bed Mobility Overal bed mobility: Needs Assistance Bed Mobility: Supine to Sit     Supine to sit: Min guard     General bed mobility comments: cues for sequence; use of gait belt to self-assist  Transfers                    Ambulation/Gait                 Stairs             Wheelchair Mobility    Modified Rankin (Stroke Patients Only)       Balance                                            Cognition Arousal/Alertness: Awake/alert Behavior During Therapy: WFL for tasks assessed/performed Overall Cognitive Status: Within Functional Limits for tasks assessed                                        Exercises Total Joint Exercises Ankle Circles/Pumps: Both;AROM;20 reps;Seated Quad Sets: AROM;Right;10 reps;Supine Heel Slides: AROM;Right;10 reps;Supine Hip ABduction/ADduction: AAROM;Right;15 reps;Supine Long Arc Quad: AROM;Right;10 reps;Seated    General Comments        Pertinent Vitals/Pain Pain Assessment: 0-10 Pain Score: 5  Pain Location: Rt hip Pain Descriptors / Indicators: Aching;Discomfort Pain Intervention(s): Limited activity within patient's  tolerance;Monitored during session;Premedicated before session    Home Living                      Prior Function            PT Goals (current goals can now be found in the care plan section) Acute Rehab PT Goals Patient Stated Goal: get back to coaching PT Goal Formulation: With patient Time For Goal Achievement: 04/15/20 Potential to Achieve Goals: Good Progress towards PT goals: Progressing toward goals    Frequency    7X/week      PT Plan Current plan remains appropriate    Co-evaluation              AM-PAC PT "6 Clicks" Mobility   Outcome Measure  Help needed turning from your back to your side while in a flat bed without using bedrails?: A Little Help needed moving from lying on your back to sitting on the side of a flat bed without using bedrails?: A Little Help needed moving to and from a bed to a chair (including a wheelchair)?: A Little Help needed standing up from a chair using  your arms (e.g., wheelchair or bedside chair)?: A Little Help needed to walk in hospital room?: A Little Help needed climbing 3-5 steps with a railing? : A Little 6 Click Score: 18    End of Session Equipment Utilized During Treatment: Gait belt Activity Tolerance: Patient tolerated treatment well Patient left: Other (comment) (sitting EOB) Nurse Communication: Mobility status PT Visit Diagnosis: Muscle weakness (generalized) (M62.81);Difficulty in walking, not elsewhere classified (R26.2)     Time: 0947-0962 PT Time Calculation (min) (ACUTE ONLY): 26 min  Charges:  $Therapeutic Exercise: 23-37 mins                     Mauro Kaufmann PT Acute Rehabilitation Services Pager [phone] Office [phone]    James Bernard 04/10/2020, 3:14 PM

## 2020-04-10 NOTE — Progress Notes (Addendum)
Swinteck, James John, MD paged regarding the pt's increased bleeding at the JP drain site throughout the night and this morning. Dressing has been changed 4 times since last night. 20 ml output on night shift, 0 ml so far today.  20 ml out today. Dressing changed a 5th time.

## 2020-04-10 NOTE — TOC Transition Note (Signed)
Transition of Care [facility]) - CM/SW Discharge Note   Patient Details  Name: James Bernard MRN: [id number] Date of Birth: [birth year]  Transition of Care [facility]) CM/SW Contact:  Clearance Coots, LCSW Phone Number: 04/10/2020, 9:44 AM   Clinical Narrative:    Therapy Plan: HEP Wide RW delivered to the patient bedside by [employer]. Patient insurance will not cover a 3 in1. Patient making arrangement for bathroom grab bar installation.  No other needs identified.    Final next level of care: Home/Self Care Barriers to Discharge: Continued Medical Work up                Discharge Plan and Services                DME Arranged: Dan Humphreys wide   Date DME Agency Contacted: 04/10/20 Time DME Agency Contacted: [phone] Representative spoke with at DME Agency: Harrold Donath           Social Determinants of Health (SDOH) Interventions     Readmission Risk Interventions No flowsheet data found.

## 2020-04-10 NOTE — Progress Notes (Signed)
Physical Therapy Treatment Patient Details Name: James Bernard MRN: [id number] DOB: 1961-02-02 Today's Date: 04/10/2020    History of Present Illness Patient is 59 y.o. male s/p RT THA anterior approach on 04/08/20 with PMH significant for OA, L4-5 back surgery.     PT Comments    Pt requiring increased time but continues to progress slowly but steadily with mobility - did require min assist to bring R LE back onto bed.  Pt reviewed car transfers verbally and states is familiar with technique.  Follow Up Recommendations  Follow surgeon's recommendation for DC plan and follow-up therapies     Equipment Recommendations  Rolling walker with 5" wheels;3in1 (PT)    Recommendations for Other Services       Precautions / Restrictions Precautions Precautions: Fall Precaution Comments: Wound Vac and JP drain Restrictions Weight Bearing Restrictions: No    Mobility  Bed Mobility Overal bed mobility: Needs Assistance Bed Mobility: Sit to Supine     Supine to sit: Min guard Sit to supine: Min assist   General bed mobility comments: cues for sequence; use of gait belt to self-assist: Pt with sudden move to bring LEs into bed and with increased pain so min assist required to assist R LE into bed.  Transfers Overall transfer level: Needs assistance Equipment used: Rolling walker (2 wheeled) Transfers: Sit to/from Stand Sit to Stand: Min guard;From elevated surface         General transfer comment: cues for use of UEs to self assist  Ambulation/Gait Ambulation/Gait assistance: Min guard;Supervision Gait Distance (Feet): 160 Feet Assistive device: Rolling walker (2 wheeled) Gait Pattern/deviations: Step-through pattern;Decreased stride length;Wide base of support;Decreased weight shift to right     General Gait Details: min cues for posture and position from RW; distance ltd by increasing back pain   Stairs         General stair comments: Pt declines to attempt  stairs - states he feels comfortable with ability from yesterday's practice   Wheelchair Mobility    Modified Rankin (Stroke Patients Only)       Balance Overall balance assessment: Needs assistance Sitting-balance support: Feet supported Sitting balance-Leahy Scale: Good     Standing balance support: During functional activity;Bilateral upper extremity supported Standing balance-Leahy Scale: Fair                              Cognition Arousal/Alertness: Awake/alert Behavior During Therapy: WFL for tasks assessed/performed Overall Cognitive Status: Within Functional Limits for tasks assessed                                        Exercises Total Joint Exercises Ankle Circles/Pumps: Both;AROM;20 reps;Seated Quad Sets: AROM;Right;10 reps;Supine Heel Slides: AROM;Right;10 reps;Supine Hip ABduction/ADduction: AAROM;Right;15 reps;Supine Long Arc Quad: AROM;Right;10 reps;Seated    General Comments        Pertinent Vitals/Pain Pain Assessment: 0-10 Pain Score: 5  Pain Location: Rt hip Pain Descriptors / Indicators: Aching;Discomfort Pain Intervention(s): Limited activity within patient's tolerance;Monitored during session;Premedicated before session;Ice applied    Home Living                      Prior Function            PT Goals (current goals can now be found in the care plan section) Acute Rehab PT Goals Patient  Stated Goal: get back to coaching PT Goal Formulation: With patient Time For Goal Achievement: 04/15/20 Potential to Achieve Goals: Good Progress towards PT goals: Progressing toward goals    Frequency    7X/week      PT Plan Current plan remains appropriate    Co-evaluation              AM-PAC PT "6 Clicks" Mobility   Outcome Measure  Help needed turning from your back to your side while in a flat bed without using bedrails?: A Little Help needed moving from lying on your back to sitting on  the side of a flat bed without using bedrails?: A Little Help needed moving to and from a bed to a chair (including a wheelchair)?: A Little Help needed standing up from a chair using your arms (e.g., wheelchair or bedside chair)?: A Little Help needed to walk in hospital room?: A Little Help needed climbing 3-5 steps with a railing? : A Little 6 Click Score: 18    End of Session Equipment Utilized During Treatment: Gait belt Activity Tolerance: Patient tolerated treatment well Patient left: in bed;with call bell/phone within reach;with bed alarm set Nurse Communication: Mobility status PT Visit Diagnosis: Muscle weakness (generalized) (M62.81);Difficulty in walking, not elsewhere classified (R26.2)     Time: 4917-9150 PT Time Calculation (min) (ACUTE ONLY): 28 min  Charges:  $Gait Training: 8-22 mins $Therapeutic Exercise: 23-37 mins $Therapeutic Activity: 8-22 mins                     James Bernard PT Acute Rehabilitation Services Pager [phone] Office [phone]    James Bernard 04/10/2020, 3:22 PM

## 2020-04-10 NOTE — Progress Notes (Signed)
Pt refused to be d/c home d/t copious amounts of drainage at the JP site. Pt feels it's unsafe. Samson Frederic, MD paged/notified.   Pt was observed emptying and measuring the JP drain output himself. Pt did well.

## 2020-04-11 DIAGNOSIS — M1611 Unilateral primary osteoarthritis, right hip: Secondary | ICD-10-CM | POA: Diagnosis not present

## 2020-04-11 LAB — CBC
HCT: 34.7 % — ABNORMAL LOW (ref 39.0–52.0)
Hemoglobin: 10.6 g/dL — ABNORMAL LOW (ref 13.0–17.0)
MCH: 28.1 pg (ref 26.0–34.0)
MCHC: 30.5 g/dL (ref 30.0–36.0)
MCV: 92 fL (ref 80.0–100.0)
Platelets: 223 10*3/uL (ref 150–400)
RBC: 3.77 MIL/uL — ABNORMAL LOW (ref 4.22–5.81)
RDW: 14.2 % (ref 11.5–15.5)
WBC: 9.6 10*3/uL (ref 4.0–10.5)
nRBC: 0 % (ref 0.0–0.2)

## 2020-04-11 NOTE — Progress Notes (Signed)
Pt demonstrated JP drain emptying and dressing changes well. Able to perform safely at home.

## 2020-04-11 NOTE — Progress Notes (Signed)
Physical Therapy Treatment Patient Details Name: James Bernard MRN: [id number] DOB: 03-17-1961 Today's Date: 04/11/2020    History of Present Illness Patient is 59 y.o. male s/p RT THA anterior approach on 04/08/20 with PMH significant for OA, L4-5 back surgery.     PT Comments    Pt with marked improvement in pain control and quality of movement this am.  Pt up to ambulate increased distance in hall, negotiate step and performed HEP with assist.  Pt stated he feels much more comfortable with dc today.   Follow Up Recommendations  Follow surgeon's recommendation for DC plan and follow-up therapies     Equipment Recommendations  Rolling walker with 5" wheels;3in1 (PT)    Recommendations for Other Services       Precautions / Restrictions Precautions Precautions: Fall Precaution Comments: Wound Vac and JP drain Restrictions Weight Bearing Restrictions: No    Mobility  Bed Mobility Overal bed mobility: Needs Assistance Bed Mobility: Supine to Sit     Supine to sit: Min guard     General bed mobility comments: pt self cueing for sequence, INcreased time with assist to hold down RW so pt could use as bed rail to self assist  Transfers Overall transfer level: Needs assistance Equipment used: Rolling walker (2 wheeled) Transfers: Sit to/from Stand Sit to Stand: Supervision         General transfer comment: cues for use of UEs to self assist  Ambulation/Gait Ambulation/Gait assistance: Min guard;Supervision Gait Distance (Feet): 240 Feet Assistive device: Rolling walker (2 wheeled) Gait Pattern/deviations: Step-through pattern;Decreased stride length;Wide base of support;Decreased weight shift to right     General Gait Details: min cues for posture and position from RW   Stairs Stairs: Yes Stairs assistance: Min guard Stair Management: No rails;Step to pattern;Backwards;With walker Number of Stairs: 2 General stair comments: single step twice with min cues  for sequence and foot/RW placement   Wheelchair Mobility    Modified Rankin (Stroke Patients Only)       Balance Overall balance assessment: Needs assistance Sitting-balance support: Feet supported Sitting balance-Leahy Scale: Good     Standing balance support: During functional activity;Bilateral upper extremity supported Standing balance-Leahy Scale: Fair                              Cognition Arousal/Alertness: Awake/alert Behavior During Therapy: WFL for tasks assessed/performed Overall Cognitive Status: Within Functional Limits for tasks assessed                                        Exercises Total Joint Exercises Ankle Circles/Pumps: Both;AROM;20 reps;Seated Quad Sets: AROM;Right;10 reps;Supine Heel Slides: AROM;Right;10 reps;Supine Hip ABduction/ADduction: AAROM;Right;15 reps;Supine Long Arc Quad: AROM;Right;10 reps;Seated    General Comments        Pertinent Vitals/Pain Pain Assessment: 0-10 Pain Score: 4  Pain Location: Rt hip Pain Descriptors / Indicators: Aching;Discomfort Pain Intervention(s): Limited activity within patient's tolerance;Monitored during session;Premedicated before session    Home Living                      Prior Function            PT Goals (current goals can now be found in the care plan section) Acute Rehab PT Goals Patient Stated Goal: get back to coaching PT Goal Formulation: With patient Time For  Goal Achievement: 04/15/20 Potential to Achieve Goals: Good Progress towards PT goals: Progressing toward goals    Frequency    7X/week      PT Plan Current plan remains appropriate    Co-evaluation              AM-PAC PT "6 Clicks" Mobility   Outcome Measure  Help needed turning from your back to your side while in a flat bed without using bedrails?: A Little Help needed moving from lying on your back to sitting on the side of a flat bed without using bedrails?: A  Little Help needed moving to and from a bed to a chair (including a wheelchair)?: A Little Help needed standing up from a chair using your arms (e.g., wheelchair or bedside chair)?: A Little Help needed to walk in hospital room?: A Little Help needed climbing 3-5 steps with a railing? : A Little 6 Click Score: 18    End of Session Equipment Utilized During Treatment: Gait belt Activity Tolerance: Patient tolerated treatment well Patient left: with call bell/phone within reach;in chair;with chair alarm set Nurse Communication: Mobility status PT Visit Diagnosis: Muscle weakness (generalized) (M62.81);Difficulty in walking, not elsewhere classified (R26.2)     Time: 1005-1050 PT Time Calculation (min) (ACUTE ONLY): 45 min  Charges:  $Gait Training: 8-22 mins $Therapeutic Exercise: 8-22 mins $Therapeutic Activity: 8-22 mins                     Mauro Kaufmann PT Acute Rehabilitation Services Pager [phone] Office [phone]    Emlyn Maves 04/11/2020, 11:35 AM

## 2020-04-11 NOTE — Progress Notes (Signed)
James Bernard  MRN: [id number] DOB/Age: 1961-04-13 59 y.o. Physician: Lynnea Maizes, M.D. 3 Days Post-Op Procedure(s) (LRB): TOTAL HIP ARTHROPLASTY ANTERIOR APPROACH (Right)  Subjective: Patient resting comfortably in bed this a.m.  Reports drainage has decreased from right thigh and change from red to yellowish. Vital Signs Temp:  [97.8 F (36.6 C)-98.4 F (36.9 C)] 98.4 F (36.9 C) (11/13 0554) Pulse Rate:  [71-87] 71 (11/13 0555) Resp:  [17-19] 17 (11/13 0554) BP: (131-159)/(78-97) 131/88 (11/13 0555) SpO2:  [96 %-100 %] 96 % (11/13 0554)  Lab Results Recent Labs    04/10/20 0322 04/11/20 0339  WBC 11.2* 9.6  HGB 11.3* 10.6*  HCT 37.1* 34.7*  PLT 231 223   BMET Recent Labs    04/09/20 0309  NA 134*  K 4.8  CL 99  CO2 26  GLUCOSE 152*  BUN 18  CREATININE 0.81  CALCIUM 8.5*   INR  Date Value Ref Range Status  03/31/2020 1.0 0.8 - 1.2 Final    Comment:    (NOTE) INR goal varies based on device and disease states. Performed at [facility], [address], [place], [place] [address]      Exam  Gauze dressing around JP drain partially saturated with serous fluid.  New dry dressings applied.  Vacuum dressing intact over incision.  Remains neurovascular intact.  Proving mobility.  Impression status post right total hip arthroplasty  Plan Discussed with patient discharge plan.  Physical therapist at bedside.  Staff will instruct patient regarding dressing change and wound VAC management.  Anticipate discharge later this evening.  Follow-up with Dr. Linna Caprice as previously arranged. Baylea Milburn M Kamauri Kathol 04/11/2020, 10:21 AM   Contact # [phone]

## 2021-06-30 IMAGING — RF DG HIP (WITH PELVIS) OPERATIVE*R*
1 series · 6 of 6 positions shown · non-contrast
Comparison: Plain films right hip 03/06/2020.

CLINICAL DATA: Intraoperative imaging for right hip replacement.

EXAM:
OPERATIVE RIGHT HIP (WITH PELVIS IF PERFORMED) 6 VIEWS
TECHNIQUE: Fluoroscopic spot image(s) were submitted for interpretation
post-operatively.

[Series 1: unknown protocol · 0.20mm/px · 6 of 6 slices shown]
[im 1/6]
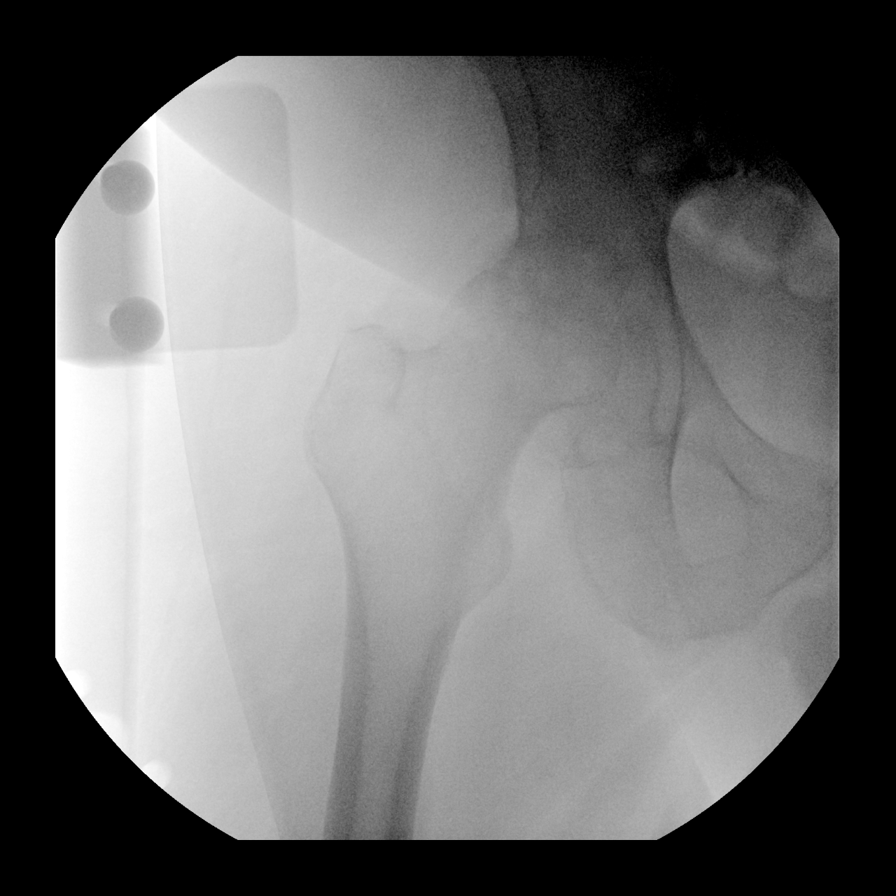
[im 2/6]
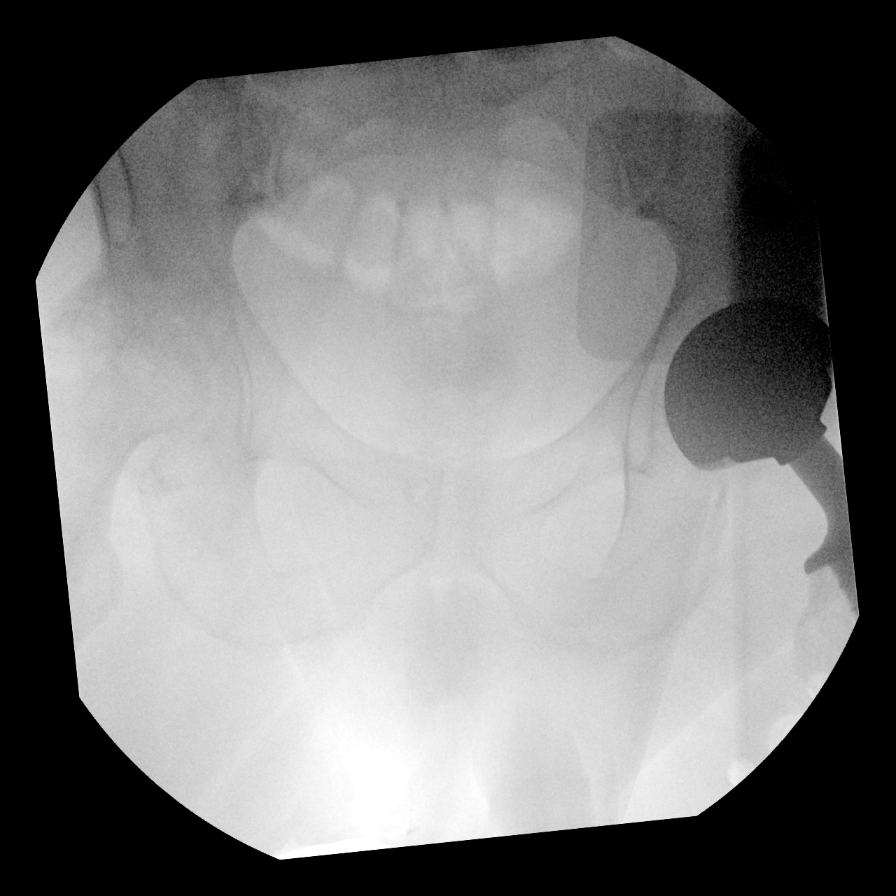
[im 3/6]
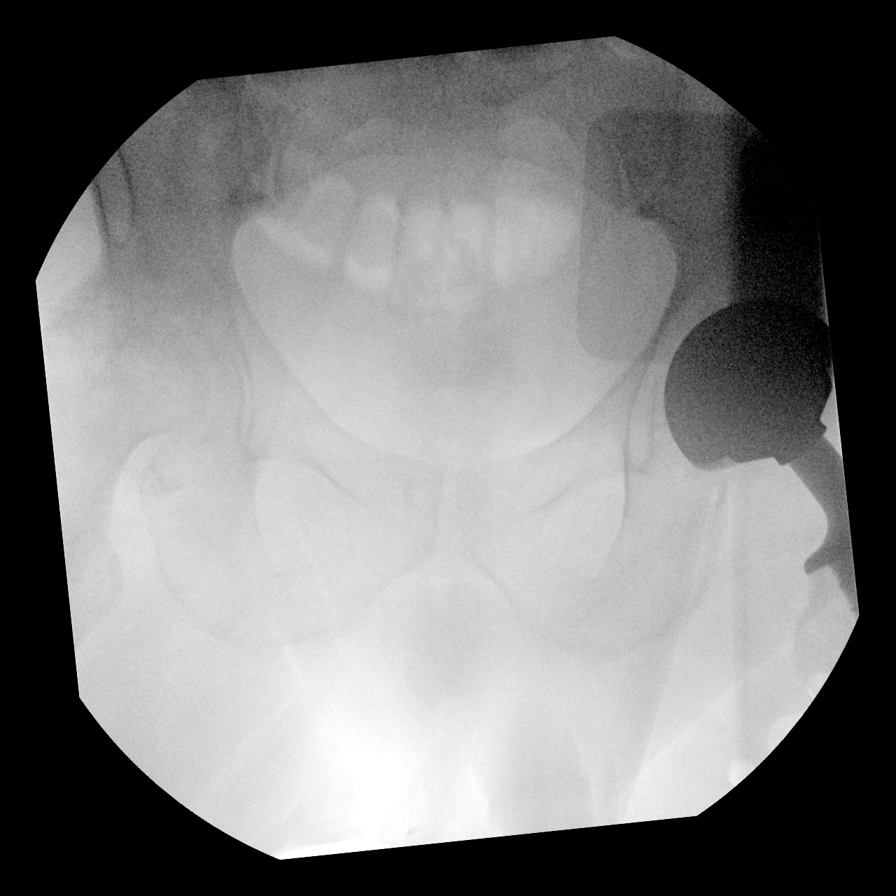
[im 4/6]
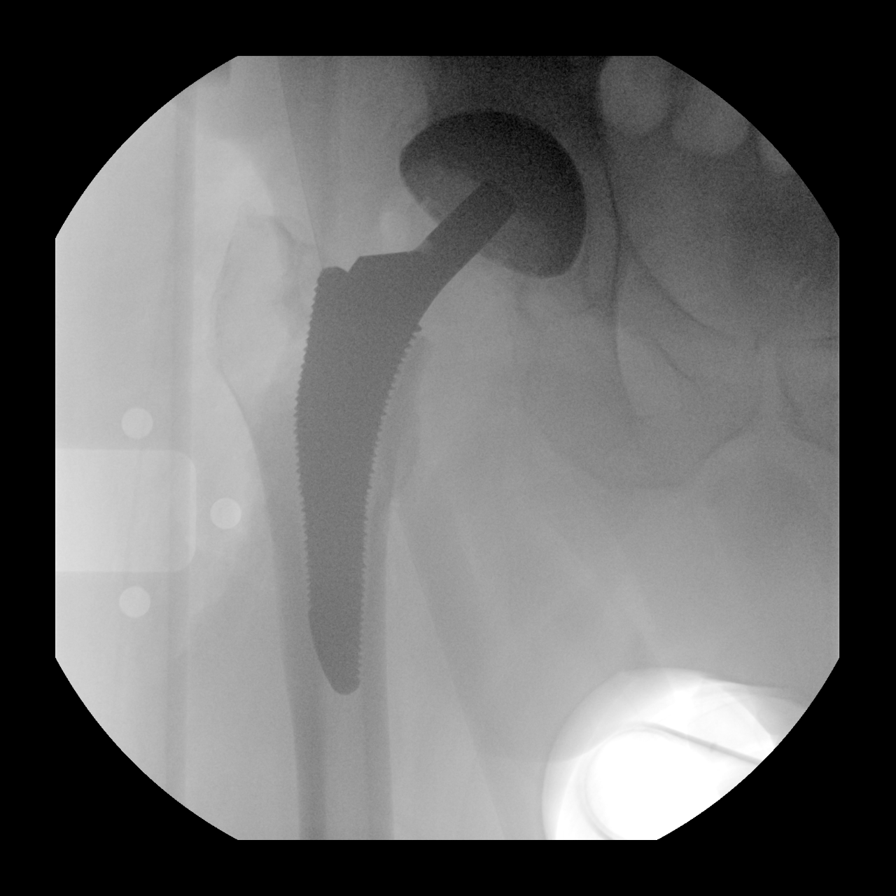
[im 5/6]
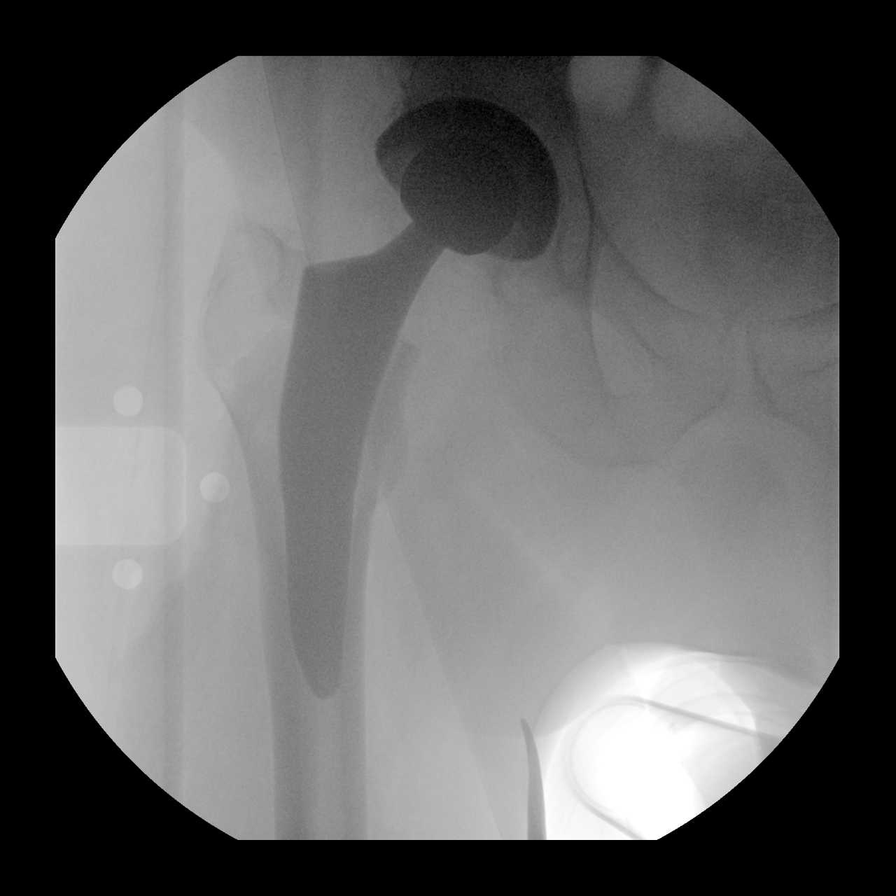
[im 6/6]
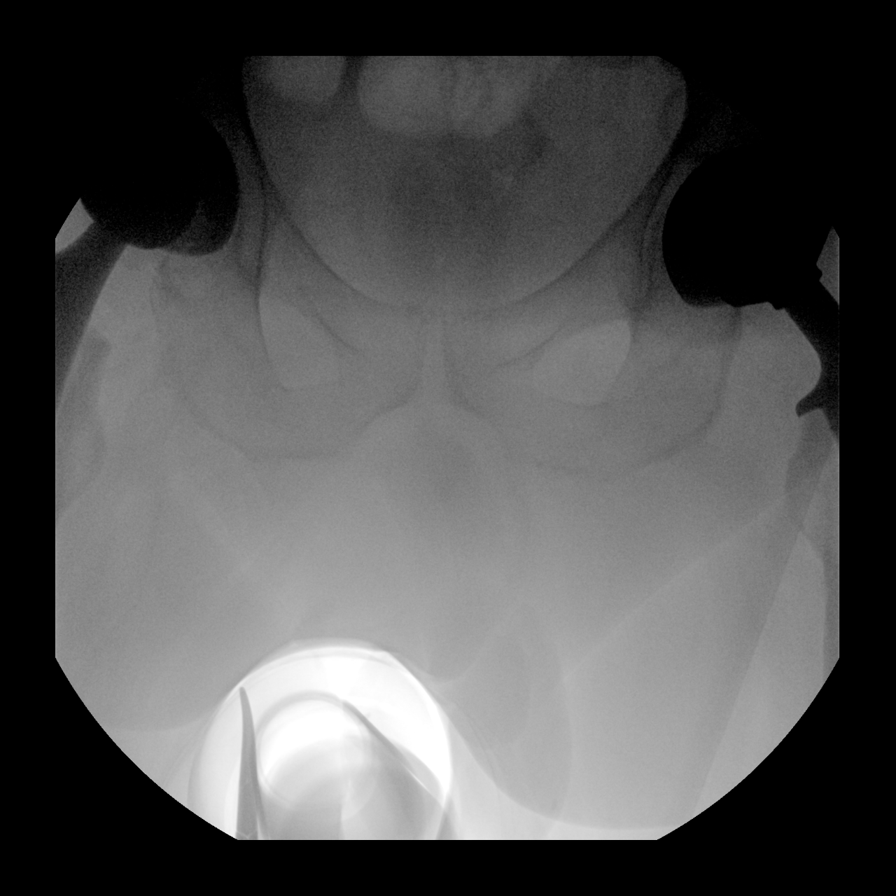

[6 of 6 positions shown; findings below may reference images not displayed]

FINDINGS: Provided series of intraoperative fluoroscopic spot views
demonstrates placement of a right total hip arthroplasty. No acute
abnormality.
IMPRESSION: Intraoperative imaging for right hip replacement.  No acute finding.

## 2024-01-30 DIAGNOSIS — S233XXA Sprain of ligaments of thoracic spine, initial encounter: Secondary | ICD-10-CM | POA: Diagnosis not present

## 2024-01-30 DIAGNOSIS — S134XXA Sprain of ligaments of cervical spine, initial encounter: Secondary | ICD-10-CM | POA: Diagnosis not present

## 2024-01-30 DIAGNOSIS — S338XXA Sprain of other parts of lumbar spine and pelvis, initial encounter: Secondary | ICD-10-CM | POA: Diagnosis not present

## 2024-02-01 DIAGNOSIS — S338XXA Sprain of other parts of lumbar spine and pelvis, initial encounter: Secondary | ICD-10-CM | POA: Diagnosis not present

## 2024-02-01 DIAGNOSIS — S134XXA Sprain of ligaments of cervical spine, initial encounter: Secondary | ICD-10-CM | POA: Diagnosis not present

## 2024-02-01 DIAGNOSIS — S233XXA Sprain of ligaments of thoracic spine, initial encounter: Secondary | ICD-10-CM | POA: Diagnosis not present

## 2024-02-02 DIAGNOSIS — S134XXA Sprain of ligaments of cervical spine, initial encounter: Secondary | ICD-10-CM | POA: Diagnosis not present

## 2024-02-02 DIAGNOSIS — S233XXA Sprain of ligaments of thoracic spine, initial encounter: Secondary | ICD-10-CM | POA: Diagnosis not present

## 2024-02-02 DIAGNOSIS — S338XXA Sprain of other parts of lumbar spine and pelvis, initial encounter: Secondary | ICD-10-CM | POA: Diagnosis not present

## 2024-02-06 DIAGNOSIS — S338XXA Sprain of other parts of lumbar spine and pelvis, initial encounter: Secondary | ICD-10-CM | POA: Diagnosis not present

## 2024-02-06 DIAGNOSIS — S233XXA Sprain of ligaments of thoracic spine, initial encounter: Secondary | ICD-10-CM | POA: Diagnosis not present

## 2024-02-06 DIAGNOSIS — S134XXA Sprain of ligaments of cervical spine, initial encounter: Secondary | ICD-10-CM | POA: Diagnosis not present

## 2024-02-08 DIAGNOSIS — S338XXA Sprain of other parts of lumbar spine and pelvis, initial encounter: Secondary | ICD-10-CM | POA: Diagnosis not present

## 2024-02-08 DIAGNOSIS — S134XXA Sprain of ligaments of cervical spine, initial encounter: Secondary | ICD-10-CM | POA: Diagnosis not present

## 2024-02-08 DIAGNOSIS — S233XXA Sprain of ligaments of thoracic spine, initial encounter: Secondary | ICD-10-CM | POA: Diagnosis not present

## 2024-02-15 DIAGNOSIS — S338XXA Sprain of other parts of lumbar spine and pelvis, initial encounter: Secondary | ICD-10-CM | POA: Diagnosis not present

## 2024-02-15 DIAGNOSIS — S134XXA Sprain of ligaments of cervical spine, initial encounter: Secondary | ICD-10-CM | POA: Diagnosis not present

## 2024-02-15 DIAGNOSIS — S233XXA Sprain of ligaments of thoracic spine, initial encounter: Secondary | ICD-10-CM | POA: Diagnosis not present

## 2024-02-20 DIAGNOSIS — S233XXA Sprain of ligaments of thoracic spine, initial encounter: Secondary | ICD-10-CM | POA: Diagnosis not present

## 2024-02-20 DIAGNOSIS — S134XXA Sprain of ligaments of cervical spine, initial encounter: Secondary | ICD-10-CM | POA: Diagnosis not present

## 2024-02-20 DIAGNOSIS — S338XXA Sprain of other parts of lumbar spine and pelvis, initial encounter: Secondary | ICD-10-CM | POA: Diagnosis not present

## 2024-02-23 DIAGNOSIS — S338XXA Sprain of other parts of lumbar spine and pelvis, initial encounter: Secondary | ICD-10-CM | POA: Diagnosis not present

## 2024-02-23 DIAGNOSIS — S233XXA Sprain of ligaments of thoracic spine, initial encounter: Secondary | ICD-10-CM | POA: Diagnosis not present

## 2024-02-23 DIAGNOSIS — S134XXA Sprain of ligaments of cervical spine, initial encounter: Secondary | ICD-10-CM | POA: Diagnosis not present

## 2024-02-29 DIAGNOSIS — S233XXA Sprain of ligaments of thoracic spine, initial encounter: Secondary | ICD-10-CM | POA: Diagnosis not present

## 2024-02-29 DIAGNOSIS — S134XXA Sprain of ligaments of cervical spine, initial encounter: Secondary | ICD-10-CM | POA: Diagnosis not present

## 2024-02-29 DIAGNOSIS — S338XXA Sprain of other parts of lumbar spine and pelvis, initial encounter: Secondary | ICD-10-CM | POA: Diagnosis not present

## 2024-03-26 DIAGNOSIS — S233XXA Sprain of ligaments of thoracic spine, initial encounter: Secondary | ICD-10-CM | POA: Diagnosis not present

## 2024-03-26 DIAGNOSIS — S134XXA Sprain of ligaments of cervical spine, initial encounter: Secondary | ICD-10-CM | POA: Diagnosis not present

## 2024-03-26 DIAGNOSIS — S338XXA Sprain of other parts of lumbar spine and pelvis, initial encounter: Secondary | ICD-10-CM | POA: Diagnosis not present

## 2024-04-16 DIAGNOSIS — S338XXA Sprain of other parts of lumbar spine and pelvis, initial encounter: Secondary | ICD-10-CM | POA: Diagnosis not present

## 2024-04-16 DIAGNOSIS — S134XXA Sprain of ligaments of cervical spine, initial encounter: Secondary | ICD-10-CM | POA: Diagnosis not present

## 2024-04-16 DIAGNOSIS — S233XXA Sprain of ligaments of thoracic spine, initial encounter: Secondary | ICD-10-CM | POA: Diagnosis not present

## 2024-05-02 DIAGNOSIS — S134XXA Sprain of ligaments of cervical spine, initial encounter: Secondary | ICD-10-CM | POA: Diagnosis not present

## 2024-05-02 DIAGNOSIS — S233XXA Sprain of ligaments of thoracic spine, initial encounter: Secondary | ICD-10-CM | POA: Diagnosis not present

## 2024-05-02 DIAGNOSIS — S338XXA Sprain of other parts of lumbar spine and pelvis, initial encounter: Secondary | ICD-10-CM | POA: Diagnosis not present
